# Patient Record
Sex: Female | Born: 1935 | Race: White | Hispanic: No | Marital: Married | Smoking: Former smoker
Health system: Southern US, Community
[De-identification: ages and names within clinical notes are randomized; demographics above are authoritative.]

## PROBLEM LIST (undated history)

## (undated) DIAGNOSIS — F0280 Dementia in other diseases classified elsewhere without behavioral disturbance: Secondary | ICD-10-CM

## (undated) DIAGNOSIS — F0390 Unspecified dementia without behavioral disturbance: Secondary | ICD-10-CM

## (undated) DIAGNOSIS — G309 Alzheimer's disease, unspecified: Secondary | ICD-10-CM

---

## 2004-08-23 ENCOUNTER — Ambulatory Visit: Payer: Self-pay | Admitting: Unknown Physician Specialty

## 2004-09-15 ENCOUNTER — Ambulatory Visit: Payer: Self-pay | Admitting: Cardiology

## 2004-11-01 ENCOUNTER — Encounter: Payer: Self-pay | Admitting: Cardiology

## 2004-11-21 ENCOUNTER — Encounter: Payer: Self-pay | Admitting: Cardiology

## 2004-12-22 ENCOUNTER — Encounter: Payer: Self-pay | Admitting: Cardiology

## 2005-01-21 ENCOUNTER — Encounter: Payer: Self-pay | Admitting: Cardiology

## 2005-02-21 ENCOUNTER — Encounter: Payer: Self-pay | Admitting: Cardiology

## 2006-03-06 ENCOUNTER — Ambulatory Visit: Payer: Self-pay | Admitting: Unknown Physician Specialty

## 2007-03-25 ENCOUNTER — Other Ambulatory Visit: Payer: Self-pay

## 2007-03-25 ENCOUNTER — Inpatient Hospital Stay: Payer: Self-pay | Admitting: Internal Medicine

## 2007-03-26 ENCOUNTER — Other Ambulatory Visit: Payer: Self-pay

## 2007-10-28 ENCOUNTER — Ambulatory Visit: Payer: Self-pay | Admitting: Unknown Physician Specialty

## 2009-08-16 ENCOUNTER — Ambulatory Visit: Payer: Self-pay | Admitting: Unknown Physician Specialty

## 2009-09-03 ENCOUNTER — Ambulatory Visit: Payer: Self-pay | Admitting: Unknown Physician Specialty

## 2009-10-03 ENCOUNTER — Inpatient Hospital Stay: Payer: Self-pay | Admitting: Student

## 2010-01-20 ENCOUNTER — Inpatient Hospital Stay: Payer: Self-pay | Admitting: Internal Medicine

## 2010-07-19 ENCOUNTER — Inpatient Hospital Stay: Payer: Self-pay | Admitting: Specialist

## 2010-11-25 ENCOUNTER — Observation Stay: Payer: Self-pay | Admitting: Internal Medicine

## 2011-04-03 ENCOUNTER — Ambulatory Visit: Payer: Self-pay | Admitting: Family Medicine

## 2011-06-28 IMAGING — CR LEFT WRIST - COMPLETE 3+ VIEW
1 series · 4 of 4 positions shown · non-contrast
Comparison: none

REASON FOR EXAM: pain and swelling
COMMENTS:   May transport without cardiac monitor

[Series 1: view not recorded · 0.17mm/px · 4 of 4 slices shown]
[im 1/4]
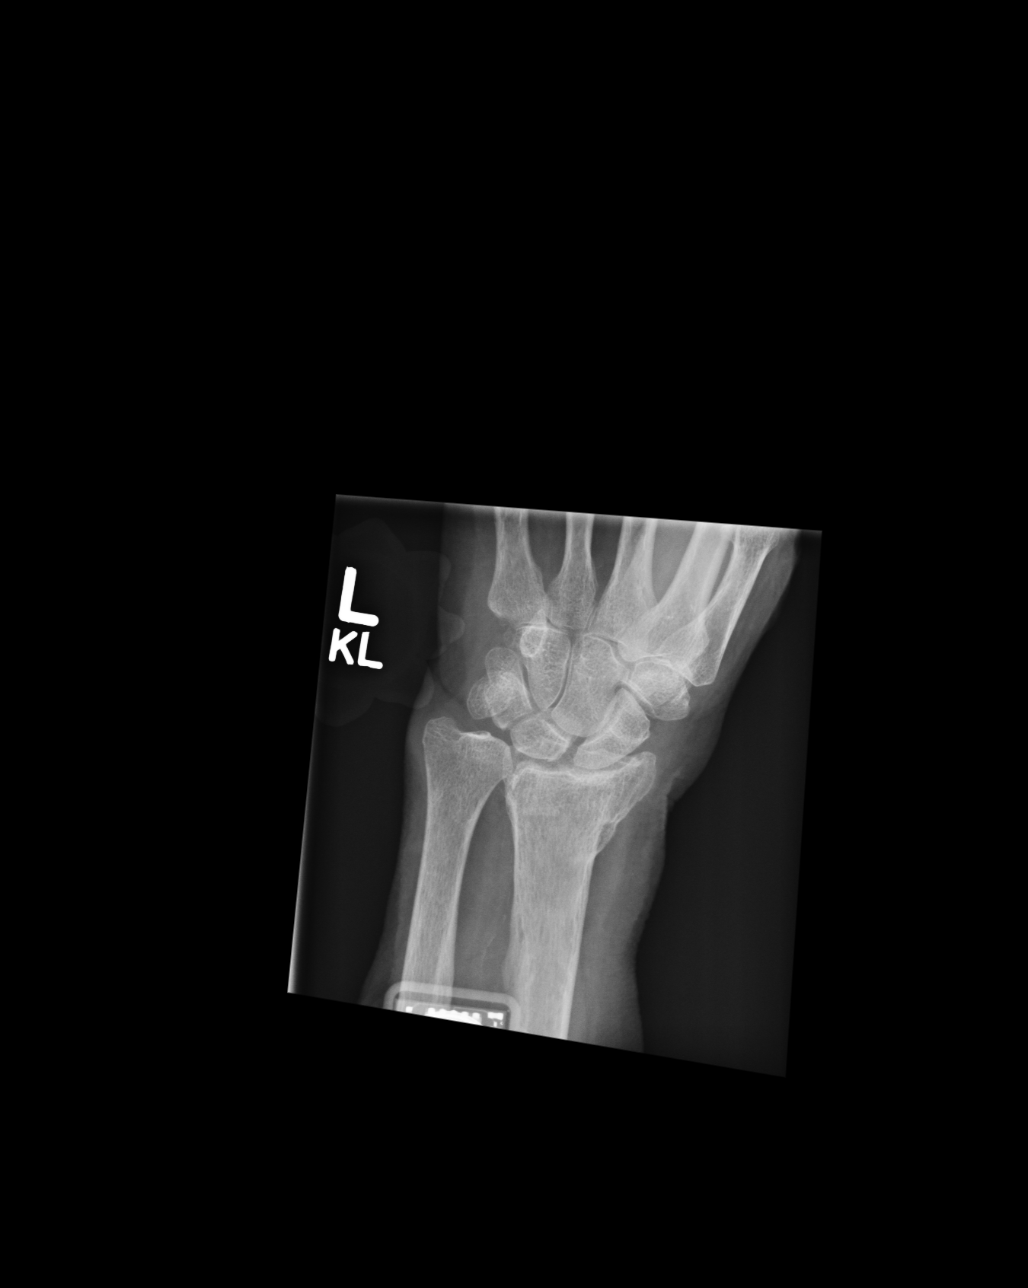
[im 2/4]
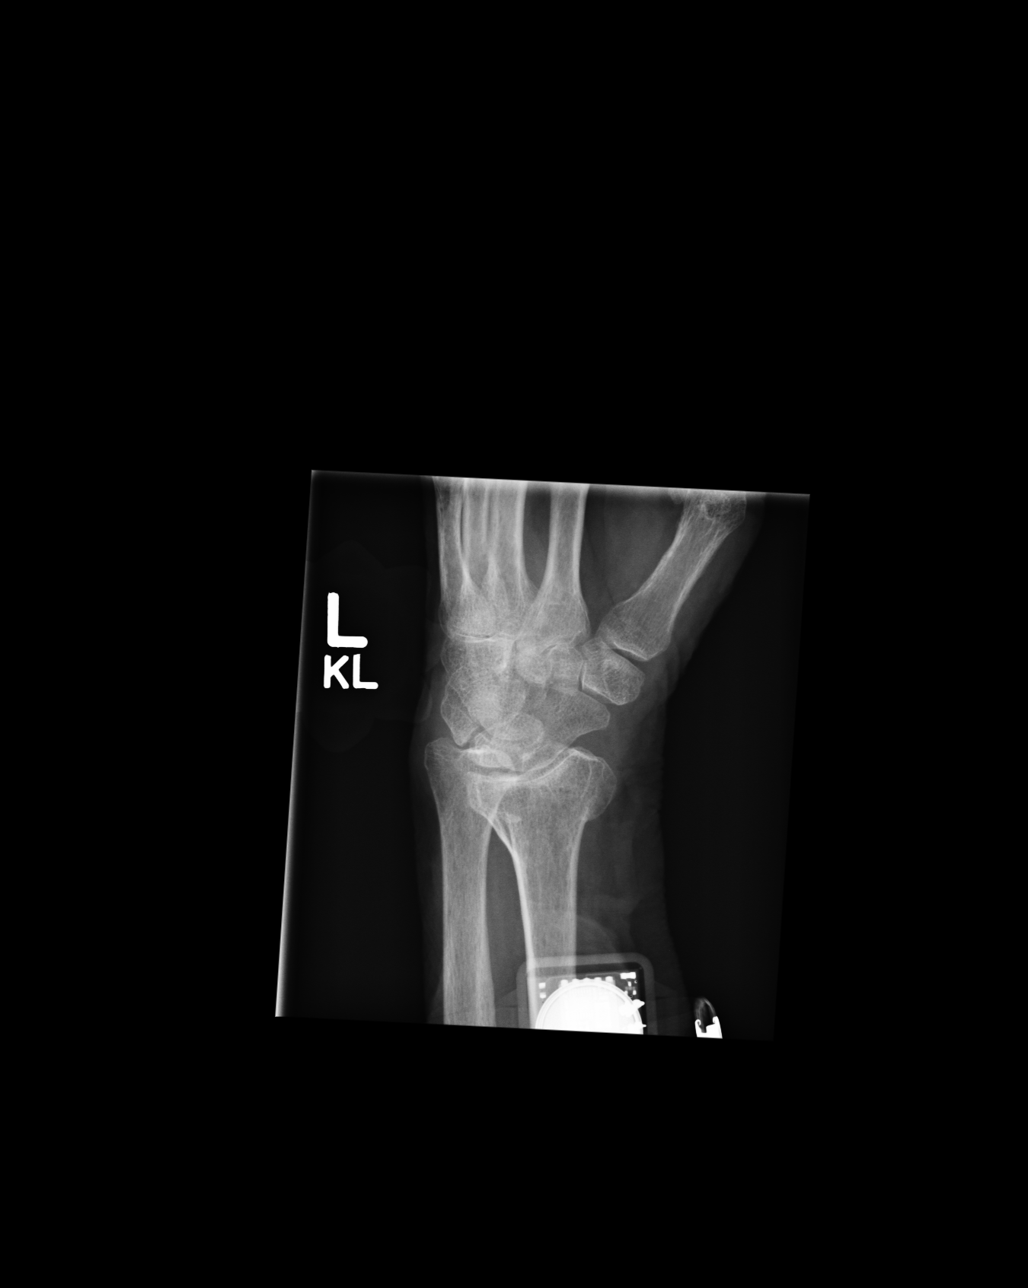
[im 3/4]
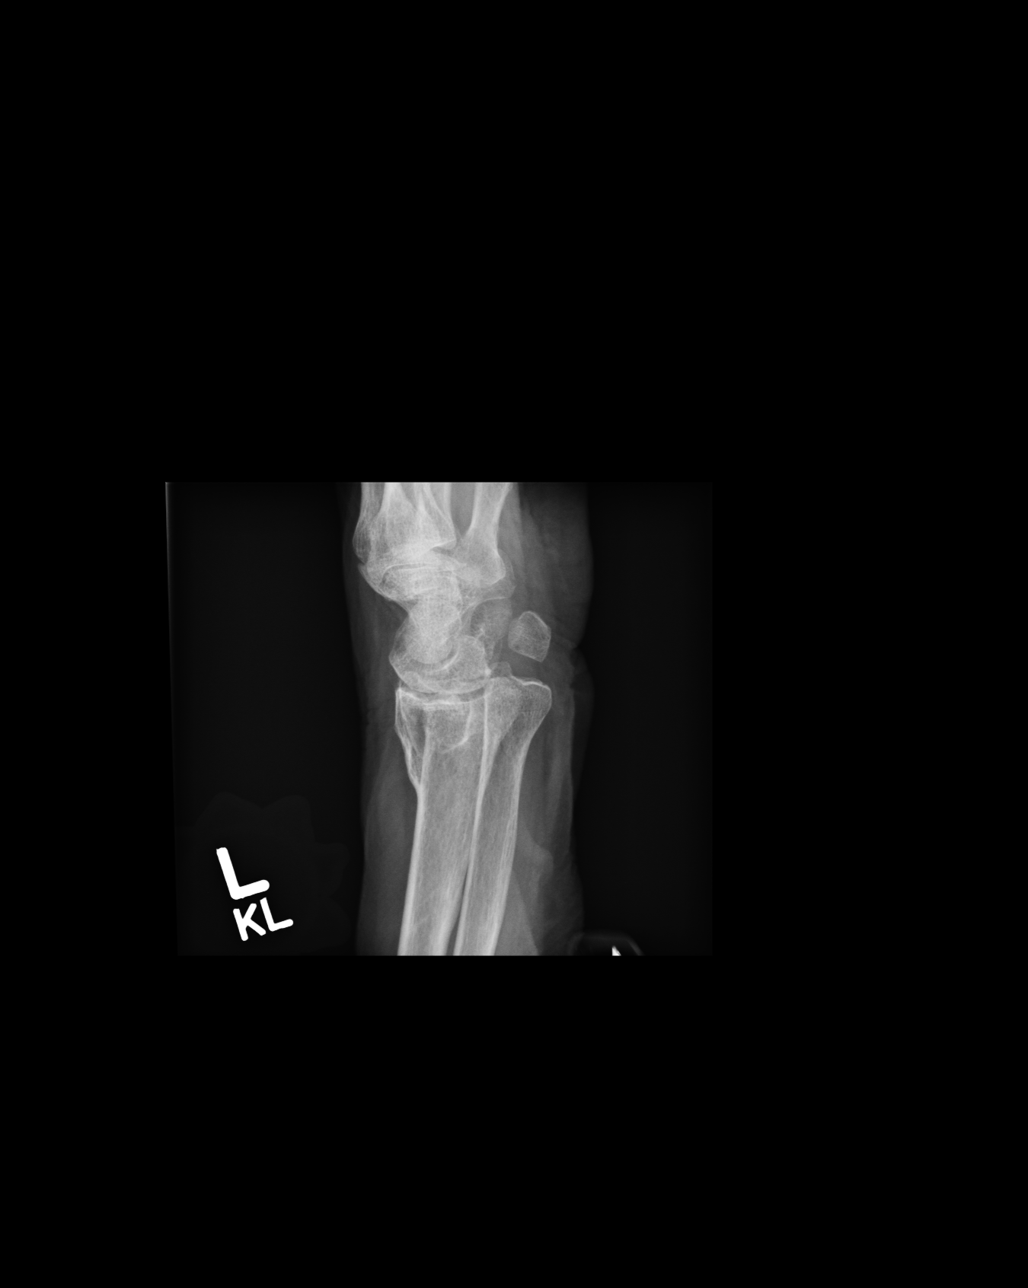
[im 4/4]
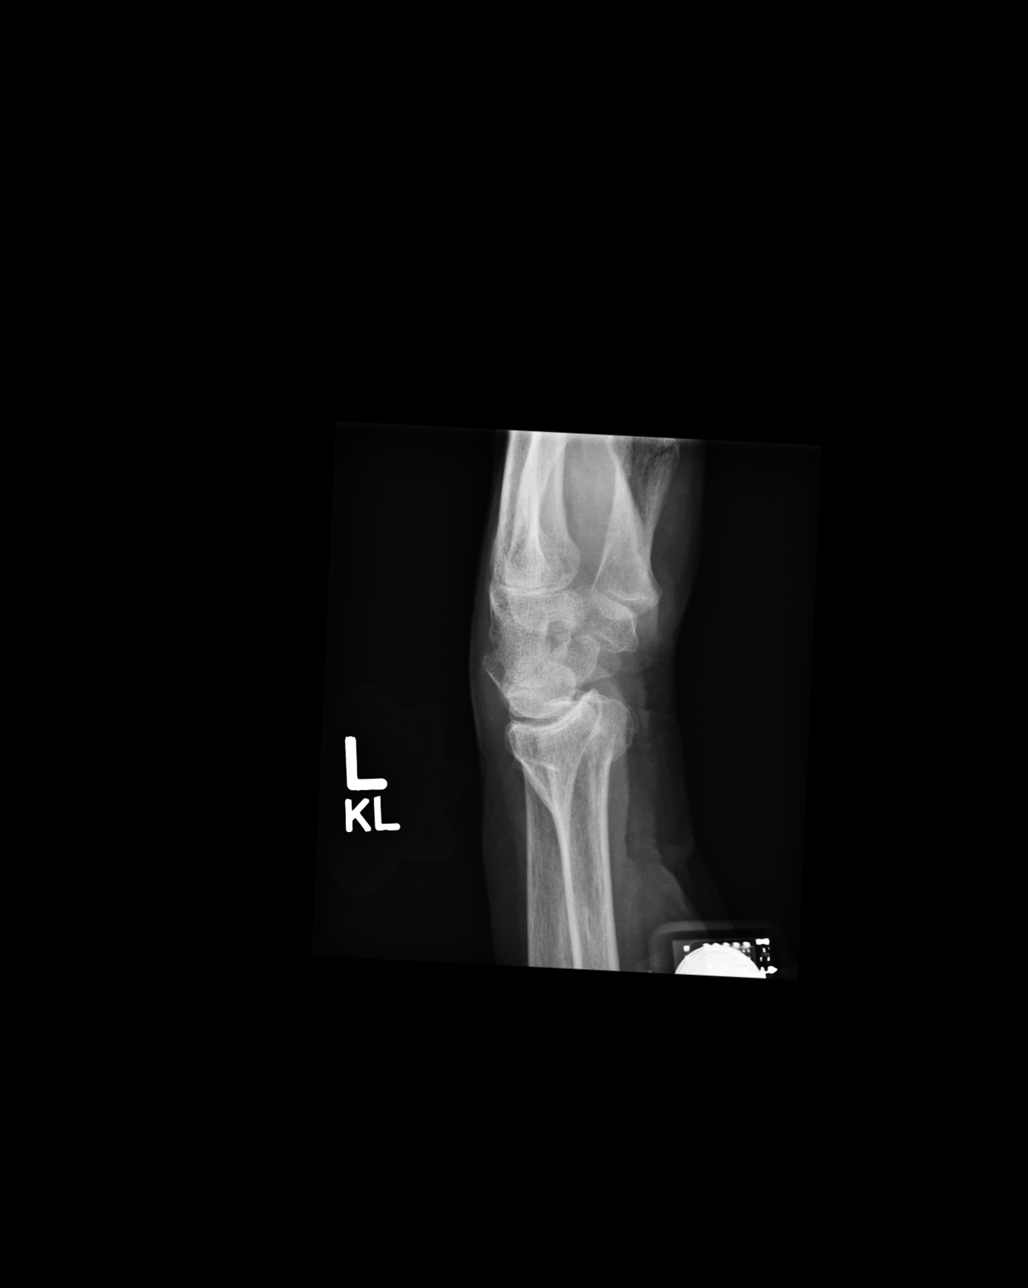

[4 of 4 positions shown; findings below may reference images not displayed]

PROCEDURE:     DXR - DXR WRIST LT COMP WITH OBLIQUES  - July 19, 2010 [DATE]

RESULT:     There is a healed or partially healed fracture of the distal
left radius. There is mild dorsal angulation of the major distal radial
fracture component. No acute fracture about the wrist is seen. The carpal
bones of the wrist are intact.
IMPRESSION: There is deformity of the distal left radius compatible with
prior fracture that has now healed or partially healed. No acute fracture is
seen.

## 2011-06-30 IMAGING — US US EXTREM LOW VENOUS BILAT
1 series · 12 of 24 positions shown · non-contrast
Comparison: none

REASON FOR EXAM: hx of DVT.  Leg swelling.  Taking Off Coumadin.
COMMENTS:

PROCEDURE:     US  - US DOPPLER LOW EXTR BILATERAL  - July 21, 2010  [DATE]
RESULT:     Bilateral lower extremity color flow Duplex Doppler reveals no
evidence of deep venous thrombosis.

[Series 1: us extrem low venous bilat · 53 acquisitions, 12 frames shown]
[im 3/53]
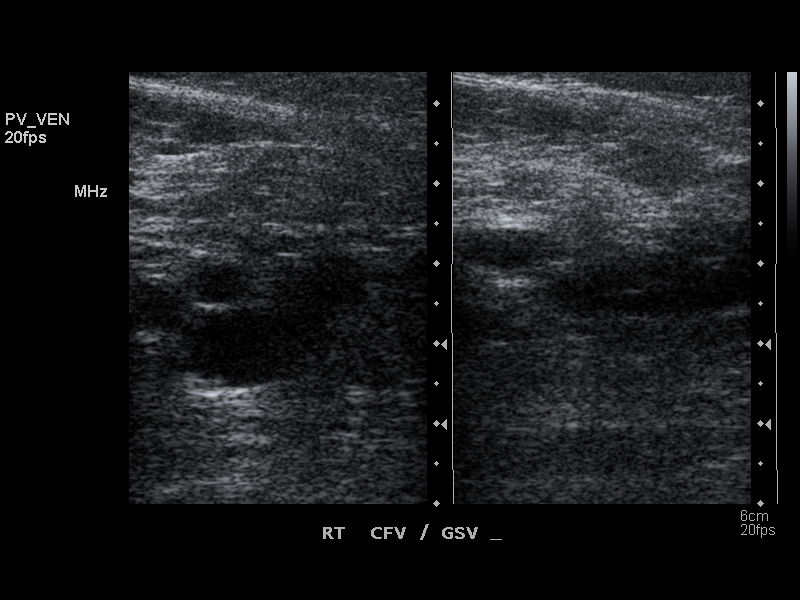
[im 7/53]
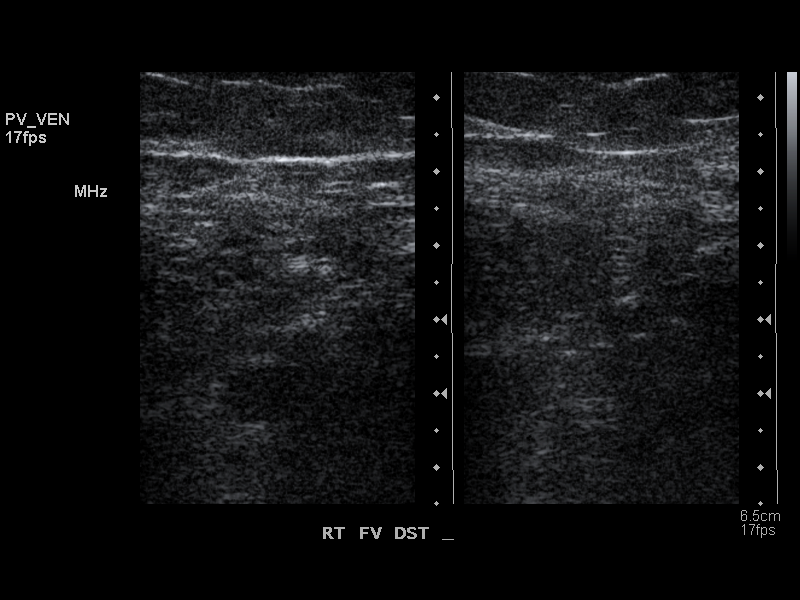
[im 12/53]
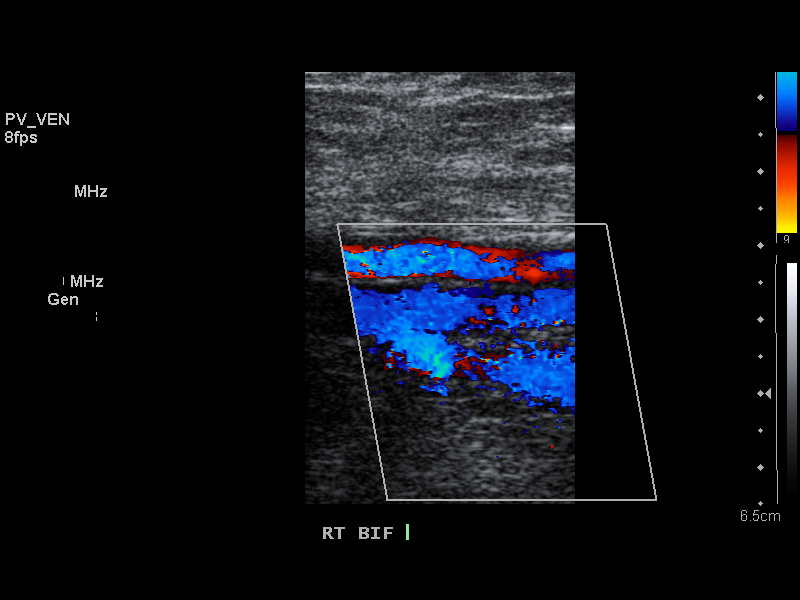
[im 16/53]
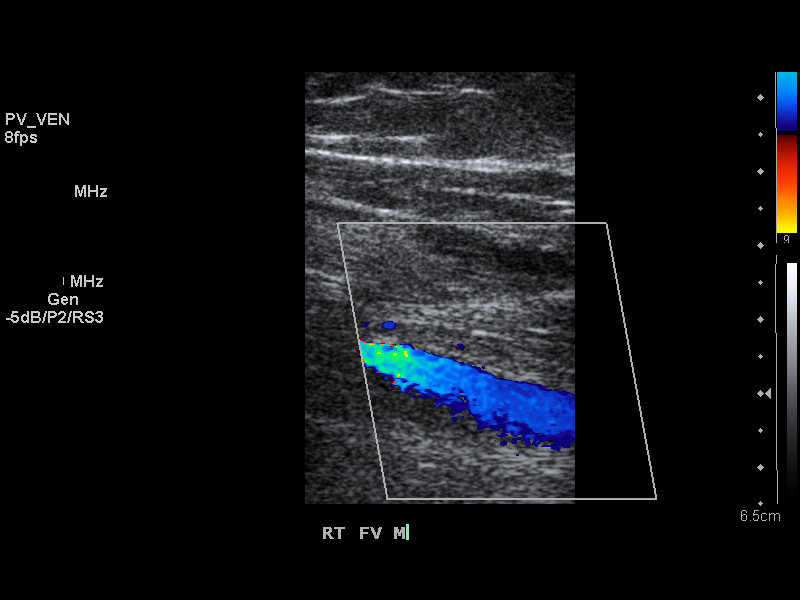
[im 21/53]
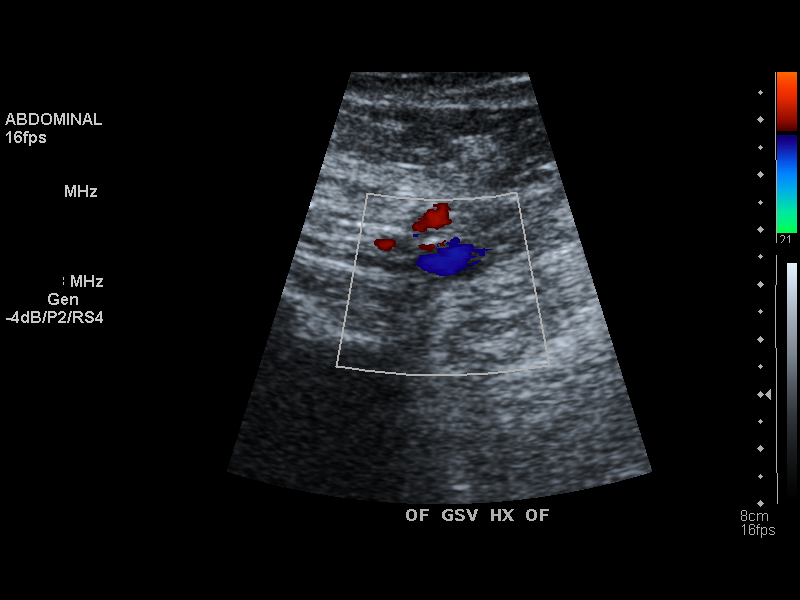
[im 25/53]
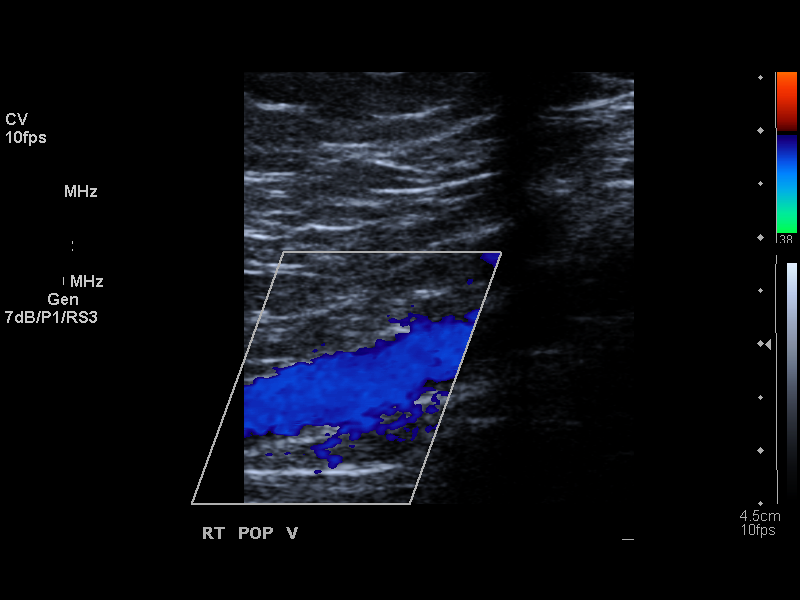
[im 32/53]
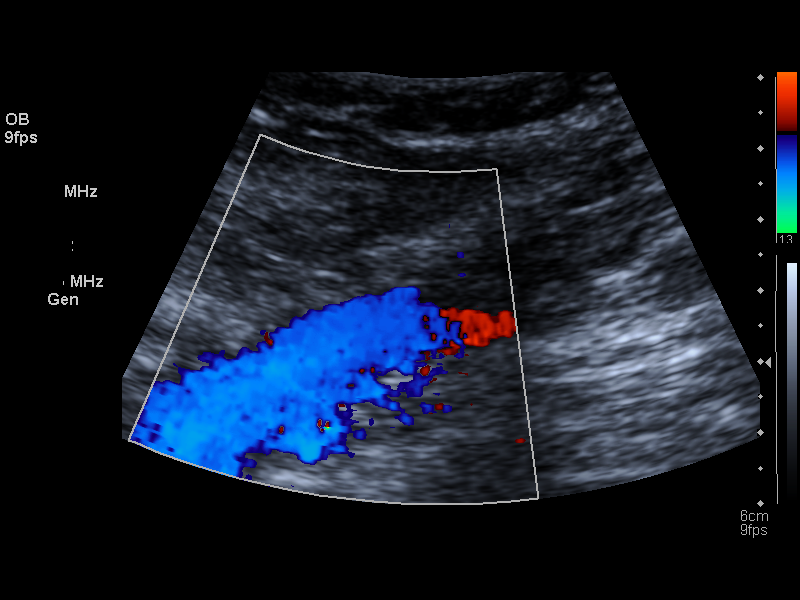
[im 37/53]
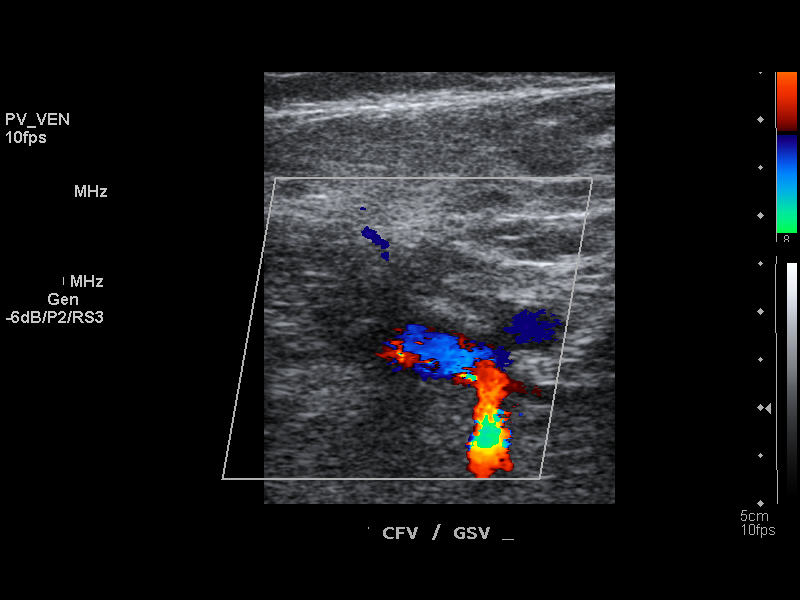
[im 41/53]
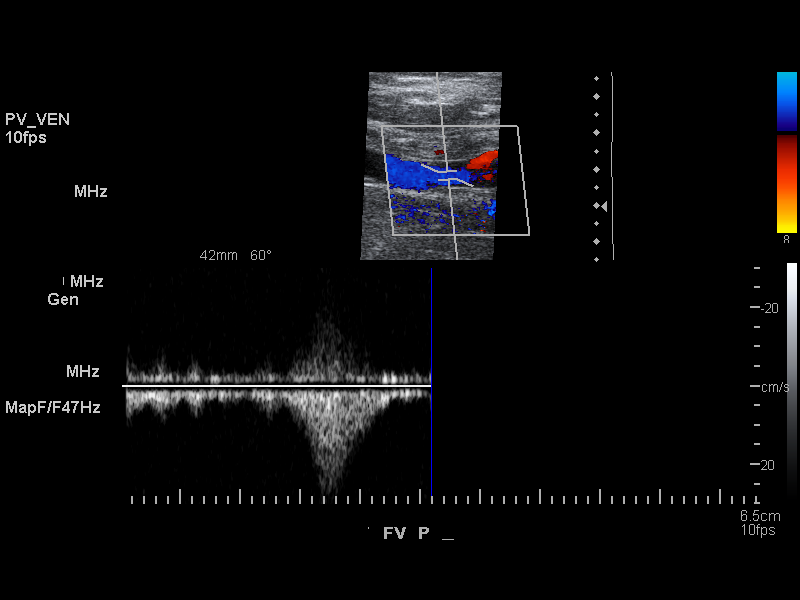
[im 46/53]
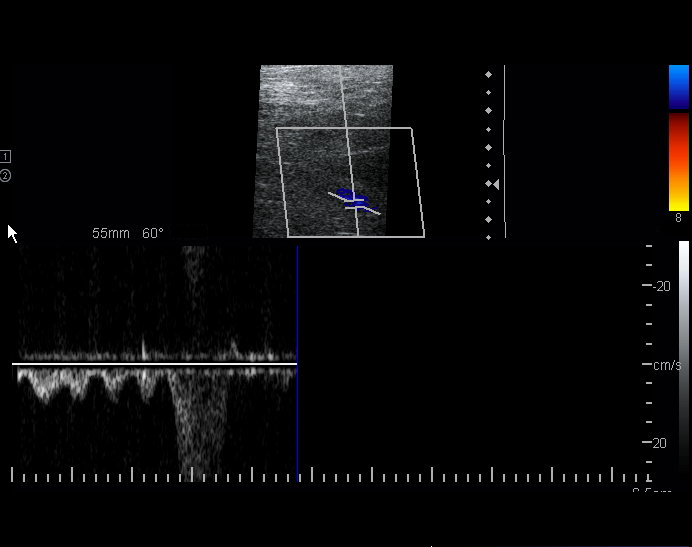
[im 48/53]
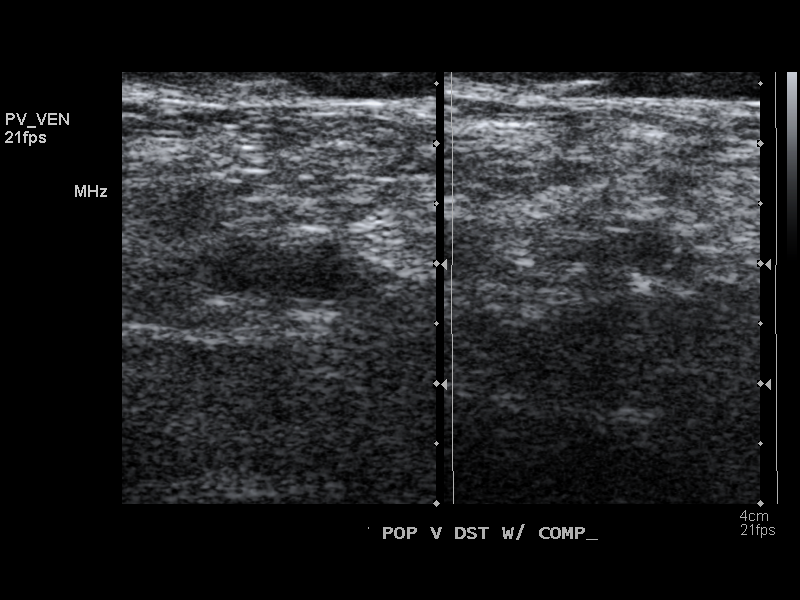
[im 53/53]
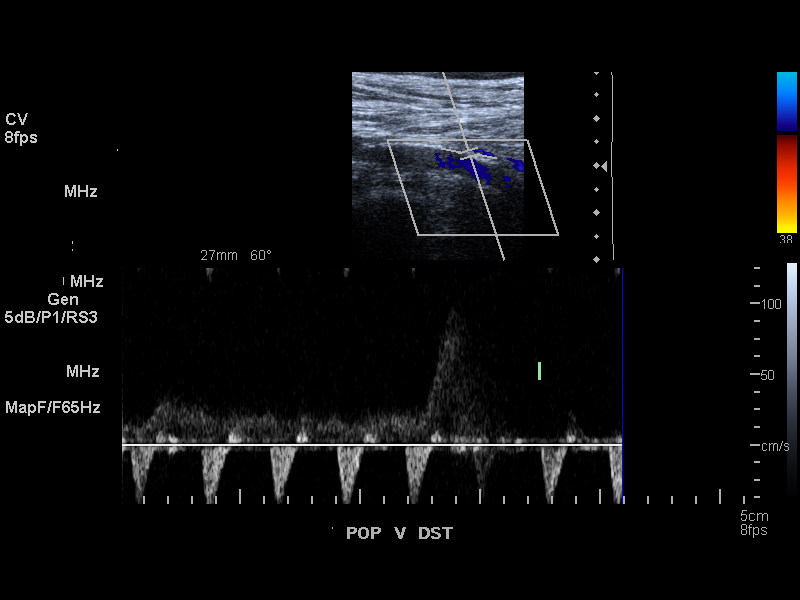

[12 of 24 positions shown; findings below may reference images not displayed]

IMPRESSION: Negative exam.

## 2012-02-28 ENCOUNTER — Inpatient Hospital Stay: Payer: Self-pay | Admitting: Internal Medicine

## 2012-02-28 LAB — CBC WITH DIFFERENTIAL/PLATELET
Basophil #: 0 10*3/uL (ref 0.0–0.1)
Basophil %: 0.1 %
Eosinophil #: 0 10*3/uL (ref 0.0–0.7)
Eosinophil %: 0 %
HCT: 40.2 % (ref 35.0–47.0)
Lymphocyte %: 2.4 %
MCH: 31.4 pg (ref 26.0–34.0)
MCV: 94 fL (ref 80–100)
Monocyte #: 1.9 x10 3/mm — ABNORMAL HIGH (ref 0.2–0.9)
Monocyte %: 9.4 %
Neutrophil #: 17.9 10*3/uL — ABNORMAL HIGH (ref 1.4–6.5)
Platelet: 172 10*3/uL (ref 150–440)
RBC: 4.27 10*6/uL (ref 3.80–5.20)
RDW: 13.3 % (ref 11.5–14.5)
WBC: 20.3 10*3/uL — ABNORMAL HIGH (ref 3.6–11.0)

## 2012-02-28 LAB — COMPREHENSIVE METABOLIC PANEL
Albumin: 2.7 g/dL — ABNORMAL LOW (ref 3.4–5.0)
Alkaline Phosphatase: 90 U/L (ref 50–136)
Anion Gap: 10 (ref 7–16)
BUN: 39 mg/dL — ABNORMAL HIGH (ref 7–18)
Calcium, Total: 8.7 mg/dL (ref 8.5–10.1)
Co2: 25 mmol/L (ref 21–32)
Glucose: 144 mg/dL — ABNORMAL HIGH (ref 65–99)
Osmolality: 291 (ref 275–301)
Potassium: 3.7 mmol/L (ref 3.5–5.1)
SGOT(AST): 16 U/L (ref 15–37)
SGPT (ALT): <6 U/L — ABNORMAL LOW (ref 12–78)
Sodium: 140 mmol/L (ref 136–145)
Total Protein: 6.7 g/dL (ref 6.4–8.2)

## 2012-02-28 LAB — URINALYSIS, COMPLETE
Bilirubin,UR: NEGATIVE
Ketone: NEGATIVE
Ph: 5 (ref 4.5–8.0)
Protein: 100
Specific Gravity: 1.019 (ref 1.003–1.030)
Squamous Epithelial: NONE SEEN

## 2012-02-28 LAB — TROPONIN I: Troponin-I: 0.04 ng/mL

## 2012-02-29 LAB — CBC WITH DIFFERENTIAL/PLATELET
Basophil #: 0 10*3/uL (ref 0.0–0.1)
Eosinophil #: 0.1 10*3/uL (ref 0.0–0.7)
HGB: 11.5 g/dL — ABNORMAL LOW (ref 12.0–16.0)
Lymphocyte #: 0.4 10*3/uL — ABNORMAL LOW (ref 1.0–3.6)
MCH: 32.3 pg (ref 26.0–34.0)
MCHC: 34.7 g/dL (ref 32.0–36.0)
MCV: 93 fL (ref 80–100)
Monocyte #: 1 x10 3/mm — ABNORMAL HIGH (ref 0.2–0.9)
Neutrophil %: 84.4 %
Platelet: 152 10*3/uL (ref 150–440)
RDW: 13.7 % (ref 11.5–14.5)

## 2012-02-29 LAB — COMPREHENSIVE METABOLIC PANEL
Alkaline Phosphatase: 78 U/L (ref 50–136)
Anion Gap: 11 (ref 7–16)
BUN: 30 mg/dL — ABNORMAL HIGH (ref 7–18)
Bilirubin,Total: 0.4 mg/dL (ref 0.2–1.0)
Chloride: 110 mmol/L — ABNORMAL HIGH (ref 98–107)
Co2: 21 mmol/L (ref 21–32)
EGFR (African American): 35 — ABNORMAL LOW
EGFR (Non-African Amer.): 30 — ABNORMAL LOW
Osmolality: 288 (ref 275–301)
SGOT(AST): 11 U/L — ABNORMAL LOW (ref 15–37)
SGPT (ALT): 6 U/L — ABNORMAL LOW (ref 12–78)
Sodium: 142 mmol/L (ref 136–145)
Total Protein: 5.6 g/dL — ABNORMAL LOW (ref 6.4–8.2)

## 2012-03-01 LAB — BASIC METABOLIC PANEL
Anion Gap: 8 (ref 7–16)
BUN: 20 mg/dL — ABNORMAL HIGH (ref 7–18)
Calcium, Total: 8.1 mg/dL — ABNORMAL LOW (ref 8.5–10.1)
Chloride: 111 mmol/L — ABNORMAL HIGH (ref 98–107)
Co2: 22 mmol/L (ref 21–32)
Creatinine: 1.38 mg/dL — ABNORMAL HIGH (ref 0.60–1.30)
EGFR (African American): 40 — ABNORMAL LOW
EGFR (Non-African Amer.): 35 — ABNORMAL LOW
Glucose: 132 mg/dL — ABNORMAL HIGH (ref 65–99)
Osmolality: 286 (ref 275–301)
Potassium: 3.4 mmol/L — ABNORMAL LOW (ref 3.5–5.1)
Sodium: 141 mmol/L (ref 136–145)

## 2012-03-01 LAB — URINE CULTURE

## 2012-03-04 LAB — CULTURE, BLOOD (SINGLE)

## 2012-05-29 ENCOUNTER — Ambulatory Visit: Payer: Self-pay | Admitting: Family Medicine

## 2012-06-30 ENCOUNTER — Observation Stay: Payer: Self-pay | Admitting: Internal Medicine

## 2012-06-30 LAB — COMPREHENSIVE METABOLIC PANEL
Albumin: 3.1 g/dL — ABNORMAL LOW (ref 3.4–5.0)
Anion Gap: 6 — ABNORMAL LOW (ref 7–16)
BUN: 11 mg/dL (ref 7–18)
Bilirubin,Total: 0.5 mg/dL (ref 0.2–1.0)
Chloride: 109 mmol/L — ABNORMAL HIGH (ref 98–107)
Co2: 26 mmol/L (ref 21–32)
Creatinine: 0.91 mg/dL (ref 0.60–1.30)
EGFR (African American): >60
EGFR (Non-African Amer.): 57 — ABNORMAL LOW
Potassium: 4.6 mmol/L (ref 3.5–5.1)
SGOT(AST): 27 U/L (ref 15–37)
Total Protein: 6.7 g/dL (ref 6.4–8.2)

## 2012-06-30 LAB — URINALYSIS, COMPLETE
Bacteria: NONE SEEN
Bilirubin,UR: NEGATIVE
Glucose,UR: NEGATIVE mg/dL (ref 0–75)
Ketone: NEGATIVE
Nitrite: NEGATIVE
Ph: 7 (ref 4.5–8.0)
Specific Gravity: 1.009 (ref 1.003–1.030)
Squamous Epithelial: <1
WBC UR: 11 /HPF (ref 0–5)

## 2012-06-30 LAB — CBC
HCT: 40.3 % (ref 35.0–47.0)
HGB: 13.4 g/dL (ref 12.0–16.0)
MCH: 29.9 pg (ref 26.0–34.0)
MCV: 90 fL (ref 80–100)
Platelet: 181 10*3/uL (ref 150–440)
RBC: 4.46 10*6/uL (ref 3.80–5.20)
WBC: 6.6 10*3/uL (ref 3.6–11.0)

## 2012-07-01 LAB — BASIC METABOLIC PANEL
Anion Gap: 9 (ref 7–16)
BUN: 11 mg/dL (ref 7–18)
Creatinine: 1.15 mg/dL (ref 0.60–1.30)
Glucose: 77 mg/dL (ref 65–99)
Osmolality: 283 (ref 275–301)
Potassium: 3.7 mmol/L (ref 3.5–5.1)

## 2012-07-01 LAB — CBC WITH DIFFERENTIAL/PLATELET
Eosinophil %: 2.6 %
HCT: 40.4 % (ref 35.0–47.0)
Lymphocyte #: 1.1 10*3/uL (ref 1.0–3.6)
Lymphocyte %: 19.6 %
MCH: 30.5 pg (ref 26.0–34.0)
MCHC: 33.5 g/dL (ref 32.0–36.0)
MCV: 91 fL (ref 80–100)
Monocyte #: 0.5 x10 3/mm (ref 0.2–0.9)
Monocyte %: 9 %
Neutrophil #: 3.9 10*3/uL (ref 1.4–6.5)
Platelet: 175 10*3/uL (ref 150–440)
RDW: 13.7 % (ref 11.5–14.5)

## 2012-07-19 LAB — URINE CULTURE

## 2013-05-21 ENCOUNTER — Emergency Department: Payer: Self-pay | Admitting: Emergency Medicine

## 2013-05-21 LAB — CBC
HCT: 43.5 % (ref 35.0–47.0)
MCH: 29.9 pg (ref 26.0–34.0)
MCHC: 33.9 g/dL (ref 32.0–36.0)
RBC: 4.93 10*6/uL (ref 3.80–5.20)
RDW: 14 % (ref 11.5–14.5)

## 2013-05-21 LAB — URINALYSIS, COMPLETE
Bilirubin,UR: NEGATIVE
Glucose,UR: NEGATIVE mg/dL (ref 0–75)
Nitrite: POSITIVE
RBC,UR: 2 /HPF (ref 0–5)

## 2013-05-21 LAB — COMPREHENSIVE METABOLIC PANEL
Albumin: 3.3 g/dL — ABNORMAL LOW (ref 3.4–5.0)
Anion Gap: 5 — ABNORMAL LOW (ref 7–16)
BUN: 15 mg/dL (ref 7–18)
Bilirubin,Total: 0.4 mg/dL (ref 0.2–1.0)
Calcium, Total: 9.2 mg/dL (ref 8.5–10.1)
Co2: 29 mmol/L (ref 21–32)
Creatinine: 1.23 mg/dL (ref 0.60–1.30)
Glucose: 92 mg/dL (ref 65–99)
Potassium: 3.6 mmol/L (ref 3.5–5.1)
SGOT(AST): 16 U/L (ref 15–37)
SGPT (ALT): 15 U/L (ref 12–78)
Sodium: 137 mmol/L (ref 136–145)

## 2013-05-21 LAB — TROPONIN I: Troponin-I: <0.02 ng/mL

## 2014-02-14 ENCOUNTER — Inpatient Hospital Stay: Payer: Self-pay | Admitting: Internal Medicine

## 2014-02-14 LAB — CBC WITH DIFFERENTIAL/PLATELET
Basophil #: 0.1 10*3/uL (ref 0.0–0.1)
Basophil %: 1.2 %
EOS PCT: 1.6 %
Eosinophil #: 0.1 10*3/uL (ref 0.0–0.7)
HCT: 48.4 % — ABNORMAL HIGH (ref 35.0–47.0)
HGB: 15.7 g/dL (ref 12.0–16.0)
LYMPHS ABS: 1.4 10*3/uL (ref 1.0–3.6)
Lymphocyte %: 18.5 %
MCH: 29.9 pg (ref 26.0–34.0)
MCHC: 32.5 g/dL (ref 32.0–36.0)
MCV: 92 fL (ref 80–100)
MONO ABS: 0.6 x10 3/mm (ref 0.2–0.9)
Monocyte %: 8.1 %
Neutrophil #: 5.5 10*3/uL (ref 1.4–6.5)
Neutrophil %: 70.6 %
Platelet: 184 10*3/uL (ref 150–440)
RBC: 5.26 10*6/uL — ABNORMAL HIGH (ref 3.80–5.20)
RDW: 15.6 % — ABNORMAL HIGH (ref 11.5–14.5)
WBC: 7.8 10*3/uL (ref 3.6–11.0)

## 2014-02-14 LAB — COMPREHENSIVE METABOLIC PANEL
ALK PHOS: 71 U/L
ALT: 19 U/L
Albumin: 3.1 g/dL — ABNORMAL LOW (ref 3.4–5.0)
Anion Gap: 7 (ref 7–16)
BUN: 15 mg/dL (ref 7–18)
Bilirubin,Total: 0.4 mg/dL (ref 0.2–1.0)
CREATININE: 1.03 mg/dL (ref 0.60–1.30)
Calcium, Total: 8.9 mg/dL (ref 8.5–10.1)
Chloride: 109 mmol/L — ABNORMAL HIGH (ref 98–107)
Co2: 27 mmol/L (ref 21–32)
EGFR (Non-African Amer.): 48 — ABNORMAL LOW
GFR CALC AF AMER: 56 — AB
Glucose: 105 mg/dL — ABNORMAL HIGH (ref 65–99)
Osmolality: 286 (ref 275–301)
Potassium: 4.1 mmol/L (ref 3.5–5.1)
SGOT(AST): 17 U/L (ref 15–37)
Sodium: 143 mmol/L (ref 136–145)
TOTAL PROTEIN: 6.9 g/dL (ref 6.4–8.2)

## 2014-02-14 LAB — APTT: ACTIVATED PTT: 41 s — AB (ref 23.6–35.9)

## 2014-02-14 LAB — URINALYSIS, COMPLETE
BLOOD: NEGATIVE
Bacteria: NONE SEEN
Bilirubin,UR: NEGATIVE
GLUCOSE, UR: NEGATIVE mg/dL (ref 0–75)
KETONE: NEGATIVE
Nitrite: NEGATIVE
Ph: 7 (ref 4.5–8.0)
Protein: 30
RBC,UR: 3 /HPF (ref 0–5)
SPECIFIC GRAVITY: 1.012 (ref 1.003–1.030)
Squamous Epithelial: 1

## 2014-02-14 LAB — PROTIME-INR
INR: 1
PROTHROMBIN TIME: 12.8 s (ref 11.5–14.7)

## 2014-02-14 LAB — TROPONIN I

## 2014-02-15 LAB — CBC WITH DIFFERENTIAL/PLATELET
Basophil #: 0.1 10*3/uL (ref 0.0–0.1)
Basophil %: 0.9 %
EOS ABS: 0.1 10*3/uL (ref 0.0–0.7)
EOS PCT: 1.7 %
HCT: 45.9 % (ref 35.0–47.0)
HGB: 15.2 g/dL (ref 12.0–16.0)
Lymphocyte #: 1 10*3/uL (ref 1.0–3.6)
Lymphocyte %: 12.5 %
MCH: 30 pg (ref 26.0–34.0)
MCHC: 33.1 g/dL (ref 32.0–36.0)
MCV: 91 fL (ref 80–100)
Monocyte #: 0.7 x10 3/mm (ref 0.2–0.9)
Monocyte %: 8.7 %
NEUTROS PCT: 76.2 %
Neutrophil #: 6.4 10*3/uL (ref 1.4–6.5)
Platelet: 173 10*3/uL (ref 150–440)
RBC: 5.06 10*6/uL (ref 3.80–5.20)
RDW: 15.2 % — ABNORMAL HIGH (ref 11.5–14.5)
WBC: 8.3 10*3/uL (ref 3.6–11.0)

## 2014-02-15 LAB — BASIC METABOLIC PANEL
ANION GAP: 8 (ref 7–16)
BUN: 13 mg/dL (ref 7–18)
CALCIUM: 8.9 mg/dL (ref 8.5–10.1)
Chloride: 108 mmol/L — ABNORMAL HIGH (ref 98–107)
Co2: 25 mmol/L (ref 21–32)
Creatinine: 0.89 mg/dL (ref 0.60–1.30)
EGFR (Non-African Amer.): 58 — ABNORMAL LOW
GLUCOSE: 98 mg/dL (ref 65–99)
Osmolality: 281 (ref 275–301)
Potassium: 3.8 mmol/L (ref 3.5–5.1)
SODIUM: 141 mmol/L (ref 136–145)

## 2014-02-16 LAB — URINE CULTURE

## 2014-02-17 LAB — BASIC METABOLIC PANEL
Anion Gap: 11 (ref 7–16)
BUN: 15 mg/dL (ref 7–18)
CALCIUM: 8.8 mg/dL (ref 8.5–10.1)
CREATININE: 1.02 mg/dL (ref 0.60–1.30)
Chloride: 106 mmol/L (ref 98–107)
Co2: 24 mmol/L (ref 21–32)
EGFR (African American): 57 — ABNORMAL LOW
EGFR (Non-African Amer.): 49 — ABNORMAL LOW
Glucose: 83 mg/dL (ref 65–99)
Osmolality: 281 (ref 275–301)
POTASSIUM: 3.1 mmol/L — AB (ref 3.5–5.1)
Sodium: 141 mmol/L (ref 136–145)

## 2014-03-17 ENCOUNTER — Emergency Department: Payer: Self-pay | Admitting: Emergency Medicine

## 2014-03-17 LAB — COMPREHENSIVE METABOLIC PANEL
ANION GAP: 11 (ref 7–16)
Albumin: 3.1 g/dL — ABNORMAL LOW (ref 3.4–5.0)
Alkaline Phosphatase: 58 U/L
BUN: 19 mg/dL — AB (ref 7–18)
Bilirubin,Total: 0.5 mg/dL (ref 0.2–1.0)
CHLORIDE: 105 mmol/L (ref 98–107)
CREATININE: 1.22 mg/dL (ref 0.60–1.30)
Calcium, Total: 9 mg/dL (ref 8.5–10.1)
Co2: 27 mmol/L (ref 21–32)
EGFR (Non-African Amer.): 40 — ABNORMAL LOW
GFR CALC AF AMER: 46 — AB
Glucose: 88 mg/dL (ref 65–99)
OSMOLALITY: 287 (ref 275–301)
Potassium: 4.1 mmol/L (ref 3.5–5.1)
SGOT(AST): 22 U/L (ref 15–37)
SGPT (ALT): 19 U/L
Sodium: 143 mmol/L (ref 136–145)
Total Protein: 6.5 g/dL (ref 6.4–8.2)

## 2014-03-17 LAB — URINALYSIS, COMPLETE
Bacteria: NONE SEEN
Bilirubin,UR: NEGATIVE
Blood: NEGATIVE
GLUCOSE, UR: NEGATIVE mg/dL (ref 0–75)
Ketone: NEGATIVE
LEUKOCYTE ESTERASE: NEGATIVE
NITRITE: NEGATIVE
PH: 6 (ref 4.5–8.0)
PROTEIN: NEGATIVE
Specific Gravity: 1.008 (ref 1.003–1.030)

## 2014-03-17 LAB — CBC
HCT: 43.1 % (ref 35.0–47.0)
HGB: 13.7 g/dL (ref 12.0–16.0)
MCH: 29.6 pg (ref 26.0–34.0)
MCHC: 31.7 g/dL — ABNORMAL LOW (ref 32.0–36.0)
MCV: 93 fL (ref 80–100)
Platelet: 147 10*3/uL — ABNORMAL LOW (ref 150–440)
RBC: 4.63 10*6/uL (ref 3.80–5.20)
RDW: 14.9 % — ABNORMAL HIGH (ref 11.5–14.5)
WBC: 5.6 10*3/uL (ref 3.6–11.0)

## 2014-03-17 LAB — TROPONIN I: TROPONIN-I: 0.02 ng/mL

## 2014-11-10 NOTE — H&P (Signed)
PATIENT NAME:  Abigail [name], STARKEY MR#:  [id number] DATE OF BIRTH:  08/31/1923  DATE OF ADMISSION:  02/28/2012  REFERRING PHYSICIAN: Dr. Chiquita Loth  PRIMARY CARE PHYSICIAN: Dr. Silver Huguenin    CHIEF COMPLAINT: Fever and altered mental status.   HISTORY OF PRESENT ILLNESS: This is an 81 year old female who is a nursing home resident was sent because of altered mental status and fever. There is no documentation of how much was the fever at the nursing home, upon presentation patient had temperature of 98, but was hypotensive with systolic blood pressure of 77, as well had labs showing acute renal failure with normal baseline creatinine currently presents with creatinine of 2.7. As well had leukocytosis of 20,000. Patient's urinalysis came back positive for urinary tract infection, patient was cultured where blood cultures and urine cultures were sent and was started on IV Levaquin in ED and is being bolused with IV normal saline. Upon my examination patient's mental status much improved where she is awake, alert, oriented x3. Patient has been complaining of generalized weakness, poor appetite, feeling tired over the last few days, feeling febrile with some chills and decreased appetite. She denies any chest pain, back pain, shortness of breath, nausea, vomiting, headache, cough, productive sputum or diarrhea.   PAST MEDICAL HISTORY:  1. Hypertension.  2. Hyperlipidemia. 3. Osteoporosis.  4. Gastroesophageal reflux disease.  5. Hypothyroidism.  6. Parkinson's disease.   ALLERGIES: Has allergy to adhesive tape and codeine.   SOCIAL HISTORY: Patient is resident at nursing home. She is a retired Engineer, civil (consulting). Denies any tobacco use, alcohol abuse or drug abuse.   FAMILY HISTORY: Both parents are deceased.   HOME MEDICATIONS:  1. Trazodone 50 mg at bedtime.  2. Zetia 10 mg daily.  3. Lipitor 20 mg daily.  4. DuoNebs as needed.  5. Flexeril 5 mg as needed.  6. Aspirin 81 mg daily.  7. Calcium 600 mg daily.   8. Colace 100 mg b.i.d.  9. Detrol 4 mg daily.  10. Effexor 75 mg daily.  11. Fosamax 70 mg weekly.  12. Maxzide 37.5/25 mg daily.  13. Metoprolol 25 mg p.o. twice a day.  14. MiraLax 17 mg at bedtime.  15. Norco 5/325 every 4 to 6 hours as needed.  16. Prilosec 20 mg daily.  17. Sinemet 50/200 mg at bedtime.  18. Synthroid 75 mcg daily.  19. Vitamin D2 1000 daily.   REVIEW OF SYSTEMS: CONSTITUTIONAL: Complains of fever, fatigue, weakness. EYES: Denies blurry vision, double vision or pain. ENT: Denies tinnitus, ear pain, hearing loss. RESPIRATORY: Denies any cough, wheezing, hemoptysis, or dyspnea. CARDIOVASCULAR: Denies any chest pain, edema, arrhythmia, palpitation. GASTROINTESTINAL: Denies nausea, vomiting, diarrhea, abdominal pain. GENITOURINARY: Complains of dysuria. No hematuria. Had right flank pain. ENDOCRINE: Denies polyuria, polydipsia, heat or cold intolerance. Has hypothyroidism. HEMATOLOGY: Denies any anemia, easy bruising, bleeding diathesis. Has history of blood clot which was treated with warfarin, currently off treatment. INTEGUMENTARY: Denies any acne, rash, or lesions. MUSCULOSKELETAL: Has history of Parkinson's disease. Denies any arthritis, gout, back pain, shoulder pain. NEUROLOGIC: Denies any numbness, focal weakness. Complains of generalized weakness. Has history of Parkinson's. PSYCH: Denies any schizophrenia, nervousness or insomnia.   PHYSICAL EXAMINATION:  VITAL SIGNS: Temperature 98, pulse 92, respiratory rate 16, blood pressure 85/54, saturating 96% on oxygen.   GENERAL: Frail, elderly female looks comfortable in no apparent distress.   HEENT: Head atraumatic, normocephalic. Pupils equal, reactive to light. Pink conjunctivae. Anicteric sclerae. Moist oral mucosa.   NECK: Supple.  No thyromegaly. No JVD.   CHEST: Good air entry bilaterally. No wheezing, rales, rhonchi.   CARDIOVASCULAR: S1, S2 heard. No rubs, murmur, gallops.   ABDOMEN: Soft, nontender,  nondistended. Bowel sounds present. Has right CVA tenderness on percussion.   EXTREMITIES: No edema. No clubbing. No cyanosis.   PSYCHIATRIC: Appropriate affect. Awake, alert x3. Intact judgment and insight.  NEUROLOGICAL: Cranial nerves grossly intact. Motor 5/5.   SKIN: No rash. Warm and dry. Normal skin turgor.   LABORATORY, DIAGNOSTIC AND RADIOLOGICAL DATA: Glucose 144, BUN 39, creatinine 2.71, sodium 140, potassium 3.7, chloride 105, CO2 25, troponin 0.04, white blood cells 20.3, hemoglobin 13.4, hematocrit 40.2, platelets 172. Urinalysis white blood cells too numerous to count, +2 leukocytes trace.   ASSESSMENT AND PLAN: This is an 81 year old female presents with:  1. Sepsis, most likely due to pyelonephritis. Patient febrile at the nursing home with leukocytosis and hypotension. Will start patient on Levaquin. Will follow on blood and urine culture. Will continue with IV fluid resuscitation and will adjust antibiotics depending on final result on the cultures  2. Acute renal failure secondary to dehydration. Will continue with IV fluids.  3. Hypotension. This is secondary to volume depletion and sepsis. Patient is clinically dehydrated. Will replete volume initially as I think most of her hypotension is secondary to dehydration and volume depletion.  4. Hypothyroidism. Continue with Synthroid.  5. Parkinson disease. Continue with Sinemet.  6. Hypertension. Will hold hypertensive medications as patient is currently hypotensive.  7. Gastroesophageal reflux disease. Continue with PPI.  8. CODE STATUS: Patient is DO NOT RESUSCITATE. Discussed with the patient. As well patient has DO NOT RESUSCITATE sheet from the nursing home.   TOTAL TIME SPENT ON PATIENT CARE: 50 minutes.   ____________________________ Starleen Armsawood S. Vasilisa Vore, MD dse:cms D: 02/28/2012 02:57:43 ET T: 02/28/2012 07:54:33 ET JOB#: [id number]  cc: Starleen Armsawood S. Tyeesha Riker, MD, <Dictator> Yetta FlockAileen H. Miller, MD Maleea Camilo Teena IraniS Diara Chaudhari  MD ELECTRONICALLY SIGNED 02/29/2012 0:16

## 2014-11-10 NOTE — Discharge Summary (Signed)
PATIENT NAME:  Abigail [name], Abigail [name] MR#:  [id number] DATE OF BIRTH:  08/09/1923  DATE OF ADMISSION:  02/28/2012 DATE OF DISCHARGE:  03/01/2012  ADMITTING DIAGNOSIS: Fever and altered mental status.   DISCHARGE DIAGNOSES:  1. Fever and altered mental status due to possible sepsis likely due to pyelonephritis. Urine culture shows Escherichia coli.  2. Acute encephalopathy likely due to urinary tract infection, now improved.  3. Acute renal failure, improved with intravenous hydration. Her HCTZ/triamterene is currently on hold.  4. Hypotension. Again, felt to be due to volume depletion and sepsis, now resolved. Blood pressure elevated. Her metoprolol will be restarted.  5. Hypothyroidism.  6. Parkinson's disease.  7. Hypertension.  8. Gastroesophageal reflux disease.  9. Osteoporosis.   PERTINENT LABORATORY, DIAGNOSTIC AND RADIOLOGICAL DATA: BMP: Glucose 70, BUN 30, creatinine 1.54, sodium 142, potassium 3.4, chloride 110, CO2 27, calcium 7.6. LFTs were normal with a low albumin. Magnesium on 08/07 was 1.5. Most recent BMP today shows a glucose of 132, BUN 20, creatinine 1.38, sodium 141, potassium 3.4, chloride 111. CBC on admission was WBC 20.3, hemoglobin 13.4, platelet count 172. WBC count on 08/08 was normal with a 10.1 hemoglobin. Urine culture showed greater than 100,000 Escherichia coli. Blood cultures: No growth at 48 hours.   CONSULTANTS: None.   HOSPITAL COURSE: Please refer to history and physical done by the admitting physician. The patient is an 81 year old female who was a nursing home resident was sent because of altered mental status and fever. The patient was noted to have a WBC count of 20,000 on presentation. Creatinine was noted to be 2.7. The patient also was noted to be hypotensive on presentation and the patient was admitted for urinary tract infection with sepsis. Was given IV fluids and started on IV Levaquin. Once her urine cultures came back, it confirmed that she had  Escherichia coli urinary tract infection sensitive to quinolones. She will be continued on Cipro. She is doing clinically much better and is stable to return back to her skilled nursing facility at discharge. At this time, the patient's Maxzide is stopped due to elevated creatinine. In the next few days if blood pressure starts increasing, can resume Maxzide.   DISCHARGE MEDICATIONS:  1. Continue vitamin D 1,000 units 1 tab p.o. daily.  2. Aspirin 81 mg 1 tab p.o. daily.  3. Sinemet 5/200, one tab p.o. at bedtime.  4. MiraLAX 17 grams p.o. at bedtime. 5. Calcium 600, one tab p.o. [name].i.d. with meals.  6. Metoprolol tartrate 25 mg 1 tab p.o. [name].i.d.  7. Colace 100, one tab p.o. [name].i.d.  8. Synthroid 75 mcg daily.  9. Prilosec 20 daily.  10. Fosamax 70 mg q. weekly.  11. Effexor-XR 75 p.o. daily.  12. Flexeril 5 mg as needed. 13. Lipitor 20 at bedtime.  14. Norco 5/325 q.4-6 p.r.n. pain.  15. DuoNebs as needed.  16. Trazodone 50, one tab p.o. at bedtime.  17. Zyrtec 10 daily.  18. Desonide 0.05% topical lotion apply to affected area as needed. 19. Ketoconazole 2% apply to affected area [name].i.d.  20. Cipro 500 mg 1 tab p.o. q.12 x4 days.  21. Continue Detrol LA 4 mg p.o. daily.   ACTIVITY: As tolerated with fall precautions.   FOLLOWUP: Followup with Dr. Silver HugueninAileen Miller in 2 to 4 weeks.   DIET: Low sodium, low fat, low cholesterol.       TIME SPENT ON DISCHARGE: 35 minutes.  ____________________________ Abigail ScottsShreyang H. Abigail KatzPatel, MD shp:ap D: 03/01/2012 13:20:39 ET T:  03/01/2012 13:35:31 ET JOB#: [id number]  cc: Macaulay Reicher H. Abigail Katz, MD, <Dictator> Abigail Flock, MD Abigail Carwin MD ELECTRONICALLY SIGNED 03/02/2012 10:51

## 2014-11-10 NOTE — Discharge Summary (Signed)
PATIENT NAME:  Abigail [name], Verl B MR#:  [id number] DATE OF BIRTH:  1924/02/16  DATE OF ADMISSION:  06/30/2012 DATE OF DISCHARGE:  07/01/2012  NOTE: I already did the discharge summary. The admission diagnosis should be facial swelling.   ADMISSION DIAGNOSES:  1. Facial swelling.  2. Extended-spectrum beta-lactamase urinary tract infection.   DISCHARGE DIAGNOSES: 1. Right facial swelling. 2. Extended-spectrum beta-lactamase urinary tract infection.  3. History of coronary artery disease.  4. Chronic lower back pain.   CONSULTANTS: None.   PERTINENT LABORATORY: Sodium 143, potassium 3.7, chloride 109, bicarbonate 25, BUN 11, creatinine 1.15, and glucose 77. White blood cells 5.7, hemoglobin 13.5, hematocrit 41, and platelets 175.   Urine culture shows no growth.   HOSPITAL COURSE: This is an 81 year old female who presented with facial swelling thought to be secondary to an allergic reaction.  1. Facial swelling. This is not thought to be secondary to allergic reaction or dental abscess. Her facial swelling has improved.  2. ESBL urinary tract infection. The patient was initially started on Cipro, just change to Invanz at the nursing home, which she will continue.  3. Coronary artery disease. Continue on aspirin and metoprolol.  4. Chronic back pain. It seems the patient is wheelchair bound.   DISCHARGE MEDICATIONS: 1. Artificial Tears one drop both eyes twice a day. 2. Cyclobenzaprine 5 mg every eight hours p.r.n. pain.  3. Fosamax 70 mg weekly, on Saturday.  4. Ketoconazole 2% apply to affected area twice a day for rash.  5. Metoprolol 25 mg twice a day.  6. MiraLax 17 grams at bedtime.  7. Synthroid 75 mcg daily.  8. Sinemet CR 50/200 mg daily.  9. Pataday 0.2% ophthalmic solution one drop in each eye for itching. 10. Norco 5/325 mg two tablets every four hours p.r.n. pain.  11. Effexor 150 mg daily.  12. Trazodone 100 mg at bedtime.  13. Aspirin 81 mg daily.  14. Calcium  carbonate 1 tablet twice a day. 15. Colace 100 mg twice a day.  16. Prilosec 20 mg twice a day. 17. Vitamin D3 1000 international units daily.  18. Invanz 1 gram daily for six days.   DISCHARGE DIET: Low sodium.   DISCHARGE DIET: Regular consistency.   DISCHARGE REFERRAL: PT as tolerated.   DISCHARGE FOLLOWUP: The patient can follow up with Dr. Lorie PhenixNancy Maloney in one week.  TIME SPENT: 35 minutes. ____________________________ Janyth ContesSital P. Juliene PinaMody, MD spm:slb D: 07/01/2012 11:58:53 ET     T: 07/01/2012 12:11:28 ET       JOB#: [id number] cc: Masako Overall P. Juliene PinaMody, MD, <Dictator> Leo GrosserNancy J. Maloney, MD Janyth ContesSITAL P Tomoki Lucken MD ELECTRONICALLY SIGNED 07/01/2012 12:26

## 2014-11-10 NOTE — H&P (Signed)
PATIENT NAME:  Abigail [name], BYLAND MR#:  [id number] DATE OF BIRTH:  Feb 02, 1924  DATE OF ADMISSION:  06/30/2012  ADMITTING PHYSICIAN: Enid Baas, MD   PRIMARY MD: Lorie Phenix, MD at [facility]    CHIEF COMPLAINT: Right facial swelling.   HISTORY OF PRESENT ILLNESS: [name] is an 81 year old pleasant Caucasian female with past medical history significant for coronary artery disease status post bypass graft surgery, osteoporosis, chronic low back pain with radiation to both legs, gastroesophageal reflux disease, hypothyroidism, and Parkinson's disease who was brought in from [facility] nursing home today secondary to new onset right facial swelling that started this morning. The patient was just started on Invanz intramuscular antibiotic yesterday for ESBL Escherichia coli in her urine cultures. Initially it was thought that she had an allergic reaction. In the ED she has mild swelling of her right cheek around the bony area, nonerythematous and nontender at this time. Plain CT of the maxillofacial area did not reveal any acute abnormalities. Her tongue is normal. She does not have any hives. Less likely to be an allergic reaction. Family is very concerned sending her back to [facility] at this time. She has poor dentition. No evidence of any dental abscess. No fever or white count. On further questioning, the patient did mention that she had a fall on Friday, which was two days ago, that she slid and held onto the wall but the right side of her face hit the wall. Since then she feels like it's heavy on that side so I think this is probably related to trauma at this point so I agree for observation in the hospital and if it does not get worse probably discharge back to [facility].   PAST MEDICAL HISTORY:  1. Coronary artery disease, status post bypass graft surgery in 2006.  2. Hypertension.  3. Hyperlipidemia.  4. Gastroesophageal reflux disease.  5. Osteoporosis.   6. Hypothyroidism.  7. History of DVT, on Coumadin in 2011.  8. History of zoster.  9. Hepatitis C.  10. Chronic low back pain with sciatica, very unsteady walking.  PAST SURGICAL HISTORY:  1. Coronary artery bypass graft surgery.  2. Hysterectomy.  3. Appendectomy.  4. Cataract surgery bilaterally.   ALLERGIES: Codeine, adhesive tape, and NSAIDs, Sudafed, and Premarin.    HOME MEDICATIONS:  1. Artificial tears ophthalmic solution one drop both eyes twice a day for dry eyes.  2. Aspirin 81 mg p.o. daily.  3. Calcium carbonate 600 mg p.o. b.i.d.  4. Colace 100 mg p.o. b.i.d.   5. Flexeril 5 mg p.o. q.8 hours p.r.n.  6. Effexor 150 mg daily.  7. Fosamax 70 mg q. weekly on Saturday.  8. Ketoconazole 2% topical cream apply to affected area twice a day for rash.  9. Metoprolol 25 mg p.o. b.i.d.  10. MiraLAX powder 17 grams once a day p.r.n. for constipation.  11. Norco 5/325 1 to 2 tablets every four hours as needed for pain.  12. Pataday 0.2% ophthalmic solution one drop both eyes daily for itching.  13. Prilosec 20 mg p.o. b.i.d.  14. Sinemet CR 50 mg/200 mg extended-release tablet 1 tablet once a day.  15. Synthroid 75 mcg p.o. daily.  16. Trazodone 100 mg p.o. daily.  17. Vitamin D3 1000 international units p.o. daily.   SOCIAL HISTORY: The patient has been a resident at the [facility] nursing home for about 2-1/2 years now. No history of any smoking or alcohol use. She is a  retired Engineer, civil (consulting)nurse.   FAMILY HISTORY: Both parents had hypertension. Siblings with coronary artery disease.  REVIEW OF SYSTEMS: CONSTITUTIONAL: No fever, fatigue, or weakness. EYES: Positive for blurred vision. Uses glasses and also had cataracts. No glaucoma or inflammation. ENT: No tinnitus, ear pain, hearing loss, epistaxis, or discharge. RESPIRATORY: No cough, wheeze, hemoptysis, or COPD. CARDIOVASCULAR: No chest pain, orthopnea, edema, arrhythmia, palpitations, or syncope. GI: No nausea, vomiting,  abdominal pain, hematemesis, or melena. GU: No dysuria, hematuria, renal calculus, frequency, or incontinence. ENDOCRINE: No polyuria, nocturia, thyroid problems, heat or cold intolerance. HEMATOLOGY: No anemia, easy bruising or bleeding. SKIN: No acne, rash, or lesions. MUSCULOSKELETAL: Positive for low back pain and also leg pain. No gout. NEUROLOGIC: No CVA, TIA, or seizures. PSYCHOLOGICAL: No anxiety, insomnia, or depression.   PHYSICAL EXAMINATION:    VITAL SIGNS: Temperature 98.3 degrees Fahrenheit, pulse 73, respirations 18, blood pressure 156/80, pulse oximetry 95% on room air.   GENERAL: Well built, well nourished female lying in bed not in any acute distress.   HEENT: Normocephalic, atraumatic. The right cheekbone is slightly swollen when compared to the left, nonerythematous and nontender. Pupils equal, round, reacting to light. Pupils are postsurgical. Anicteric sclerae. Extraocular movements intact. Oropharynx clear without erythema, mass, or exudates. No dental infection identified.   NECK: Supple. No thyromegaly, JVD, or carotid bruits. No lymphadenopathy.   LUNGS: Moving air bilaterally. No wheeze or crackles. No use of accessory muscles.   CARDIOVASCULAR: S1, S2 regular rate and rhythm. No murmurs, rubs, or gallops.   ABDOMEN: Soft, nontender, nondistended. No hepatosplenomegaly. Normal bowel sounds.   EXTREMITIES: No pedal edema. No clubbing or cyanosis. 2+ dorsalis pedis pulses palpable on the right side and 1+ on the left side. Left foot grade II has possible paronychia and also erythema at the distal part of the toe which is tender to touch as well.   LYMPHATIC: No cervical lymphadenopathy.   SKIN: No acne, rash, or lesions.   NEUROLOGIC: Cranial nerves intact. No focal motor or sensory deficit.   PSYCHOLOGIC: The patient is awake, alert, oriented x3.   LABORATORY DATA: WBC 6.6, hemoglobin 13.4, hematocrit 40.3, platelet count 181, sodium 141, potassium 4.6, chloride  109, bicarb 26, creatinine 0.91, glucose 76, calcium 8.8, ALT 7, AST 27, alkaline phosphatase 94, total bilirubin 0.5, albumin 3.1. Urinalysis with 1+ leukocyte esterase, 11 WBCs, no bacteria.   CT of maxillofacial area showing no fracture. Paranasal sinuses are well aerated. Globes are intact.   EKG showing normal sinus rhythm, heart rate of 76.   ASSESSMENT AND PLAN: This is an 81 year old female with past medical history of coronary artery disease status post bypass graft surgery, Parkinson's disease, arthritis, reflux, osteoporosis, and hypertension who is brought in from nursing home secondary to right facial swelling.   1. Right facial swelling. Initially it was thought to be an allergic reaction to Invanz versus dental abscess, however, on further questioning and exam I feel that it is secondary to trauma as she hit the right side of her face to the wall two days ago. The swelling is nonerythematous, nontender, and there is no white count or fever, less likely to be abscess. She is on Invanz which will cover anaerobic organisms and gram-negative coverage in the mouth. According to family's wishes, will observe her overnight in the hospital. If there is no worsening, she probably will be discharged back to [facility] tomorrow.  2. ESBL urinary tract infection. She was initially on Cipro b.i.d. but the culture results at  the nursing home came back yesterday as ESBL Escherichia coli so was started on Invanz intramuscular daily for seven days. She received one dose yesterday. Will continue that for now.  3. Coronary artery disease status post bypass graft surgery, appears stable. No chest pain. Continue aspirin and metoprolol.  4. Hypertension. On metoprolol.  5. Chronic low back pain. Use pain medications as needed. She is wheelchair bound and needs assistance. Will be discharged back to [facility] whenever she is ready.  6. Depression. Continue Effexor.  7. Hypothyroidism. Continue  Synthroid.  8. Parkinson's disease. She is on Sinemet.  9. GI and DVT prophylaxis. On Prilosec and Lovenox.   CODE STATUS: DO NOT RESUSCITATE based on the notes sent from [facility].     TIME SPENT ON ADMISSION: 50 minutes.   ____________________________ Enid Baas, MD rk:drc D: 06/30/2012 18:19:38 ET T: 07/01/2012 06:17:48 ET JOB#: [id number]  cc: Enid Baas, MD, <Dictator> Leo Grosser, MD Enid Baas MD ELECTRONICALLY SIGNED 07/09/2012 14:59

## 2014-11-10 NOTE — Discharge Summary (Signed)
PATIENT NAME:  Abigail [name], Abigail [name] MR#:  [id number] DATE OF BIRTH:  Jan 19, 1924  DATE OF ADMISSION:  06/30/2012 DATE OF DISCHARGE:  07/01/2012  ADMISSION DIAGNOSIS: Right facial swelling.   DISCHARGE DIAGNOSES:  1. Right facial swelling secondary to mechanical fall.  2. History of extended-spectrum beta-lactamase urinary tract infection.   LABORATORY, DIAGNOSTIC AND RADIOLOGICAL DATA: White blood cells 5.7, hemoglobin 13.5, hematocrit 40.4, platelets 175, sodium 143, potassium 3.7, chloride 109, bicarbonate 11, creatinine 1.15, glucose 75, magnesium 1.8. CT Maxillofacial showed no evidence of fracture.   HOSPITAL COURSE: This is a very pleasant 81 year old female who is wheelchair bound who presented with facial swelling. For further details, please refer to Dr. Prudencio PairKalisetti's history and physical.  1. Facial swelling. Patient was noted to have a recent fall which is the likely etiology of her facial swelling. She does not have any issues as far as swallowing or eating. She no acute fractures of her face per the CT scan. We do not believe that this is an allergic reaction to her recently started medication on Invanz. 2. History of ESBL urinary tract infection. Patient was started on Invanz which she should continue for six more days.  3. Hypothyroidism. Patient was continued on Synthroid.  4. Gastroesophageal reflux disease. Patient was continued on PPI.   DISCHARGE MEDICATIONS:  1. Norco 5/325, 2 tablets q.4 hours p.r.n. pain.  2. Calcium carbonate 600 [name].i.d.  3. Trazodone 100 mg at bedtime.  4. Effexor 150 mg daily.  5. Sinemet 1 tablet daily.  6. Aspirin 81 mg daily.  7. Metoprolol 25 mg [name].i.d.  8. Fosamax 70 mg once a week on Saturdays.  9. Ketoconazole 2% topical cream [name].i.d. for rash.  10. Colace 100 [name].i.d.  11. MiraLax 17 grams in 8 ounces of water daily.  12. Cyclobenzaprine 5 mg q.8 hours p.r.n. pain.  13. Artificial tears 1 drop to both eyes twice a day.  14. Pataday 0.2% ophthalmic  solution one drop in each eye for itching.  15. Prilosec 20 mg daily.  16. Synthroid 75 mcg daily.  17. Vitamin D3 1000 international units daily.  18. Invanz 1 gram IV daily.   DISCHARGE DIET: Low sodium diet.   DISCHARGE ACTIVITY: As tolerated.   CONDITION ON DISCHARGE: Patient is stable for discharge back to her facility.   DISCHARGE INSTRUCTIONS: She will need to continue on the antibiotics that she had already been on for her ESBL urinary tract infection.   TIME SPENT: 35 minutes.   ____________________________ Janyth ContesSital P. Juliene PinaMody, MD spm:cms D: 07/01/2012 08:31:34 ET T: 07/01/2012 08:50:42 ET JOB#: [id number]  cc: Harlen Danford P. Juliene PinaMody, MD, <Dictator> Leo GrosserNancy J. Maloney, MD Janyth ContesSITAL P Hamdi Vari MD ELECTRONICALLY SIGNED 07/01/2012 11:04

## 2014-11-14 NOTE — Discharge Summary (Signed)
PATIENT NAME:  Abigail [name], Abigail [name] MR#:  [id number] DATE OF BIRTH:  [birth year]  DATE OF ADMISSION:  02/14/2014 DATE OF DISCHARGE:  02/17/2014   Addendum  The patient was planned to be discharged on 02/15/2014. However, she, at some point, had symptoms of right-sided weakness and was noted to have some slurred speech. I obtained an MRI which was consistent with small bilateral lacunar infarcts. She underwent stroke workup, including a 2-D echocardiogram which showed EF of 60% to 65%. No cardiac source of embolism is identified. She also had carotid Dopplers with less than 50% stenosis bilaterally. Neurology did see the patient in consultation. She was continued on aspirin. Dr. Katrinka BlazingSmith from neurology recommended this for 3 months in addition to Plavix 75 mg daily, which she will take lifelong. She needs PT, OT, speech at discharge. Speech did come and see the patient during the hospitalization. She is currently on a mechanical soft diet with aspiration precautions and pureed meats.   Urinary tract infection was positive for E. coli, pansensitive. She will be discharged with Keflex.   DISCHARGE MEDICATIONS:  1. Artificial tears [name].i.d. p.r.n.  2. Fosamax 70 mg on Saturdays. 3. Metoprolol 25 mg [name].i.d.  4. MiraLax 17 grams daily.  5. Sinemet 50/200 daily.  6. Synthroid 75 mcg daily.  7. Effexor 150 mg daily.  8. Calcium 600 mg [name].i.d.   9. Prilosec 20 mg [name].i.d.  10. Hydrochlorothiazide 12.5 mg daily.  11. Vitamin D3 1000 international units daily. 12. Trazodone 50 mg at bedtime.  13. Oxybutynin 5 mg [name].i.d.  14. Colace 100 mg q.12 hours.  15. Pataday ophthalmic solution p.r.n.  16. Norco 5/325 one to two tablets q.4 hours p.r.n. pain.  17. Lisinopril 40 mg daily.  18. Aspirin 81 mg daily for 3 months, stop 05/17/2014.  19. Keflex 500 mg p.o. t.i.d. for 8 days.  20. Plavix 75 mg daily.   DISCHARGE DIET: Low sodium, mechanical soft with pureed meats and aspiration precautions.   REFERRAL: PT, OT,  speech.   DISCHARGE INSTRUCTIONS: The patient needs daily blood pressure monitoring, goal less than 130/80. The patient's blood pressure medications should reflect this.  Plan of care was discussed with the son.   TIME SPENT: Approximately 31 minutes.   ____________________________ Janyth ContesSital P. Juliene PinaMody, MD spm:lb D: 02/17/2014 09:12:42 ET T: 02/17/2014 09:23:26 ET JOB#: [id number]  cc: Liddie Chichester P. Juliene PinaMody, MD, <Dictator> Leo GrosserNancy J. Maloney, MD Janyth ContesSITAL P Maryalyce Sanjuan MD ELECTRONICALLY SIGNED 02/17/2014 12:44

## 2014-11-14 NOTE — Consult Note (Signed)
Referring Physician:  Aldean Jewett :   Primary Care Physician:  Aldean Jewett : [facility], [address] [place], [place] [address], [place] [phone]  Reason for Consult: Admit Date: 15-Feb-2014  Chief Complaint: altered mental status  Reason for Consult: altered mental status   History of Present Illness: History of Present Illness:   81 yo RHD F presents from nursing home due to altered mental status.  Pt was found to have an UTI which once was treated pt improved.  MRI was ordered on pt which showed two infarcts so neurology was consulted.  Pt is a normal NH resident and likely close to her baseline with dementia.  ROS:  Review of Systems   unobtainable secondary to dementia  Past Medical/Surgical Hx:  Constipation:   Chronic Kidney Disease:   Hyperlipidemia:   Hypothyroidism:   Pyelonephritis:   Sepsis:   Bronchitis:   depression:   dvt:   kidney dz:   glaucoma:   reflux:   osteoporosis:   L1 compression fx:   syncope:   Hypertension:   Hepatitis B:   CABG (Coronary Artery Bypass Graft):   Past Medical/ Surgical Hx:  Past Medical History reviewed by me as above   Past Surgical History reviewed by me as above   Home Medications: Medication Instructions Last Modified Date/Time  cephalexin 500 mg oral capsule 1 cap(s) orally 3 times a day x 8 days 26-Jul-15 08:58  Norco 5 mg-325 mg oral tablet 1-2 tab(s) orally every 4 hours, As Needed - for Pain 26-Jul-15 08:58  lisinopril 20 mg oral tablet 1 tab(s) orally once a day 26-Jul-15 08:58  Artificial Tears ophthalmic solution 1 drop(s) to each eye 2 times a day, As Needed for dry eyes 25-Jul-15 17:46  Fosamax 70 mg oral tablet 1 tab(s) orally once a week on Saturday (0630) 25-Jul-15 17:46  metoprolol tartrate 25 mg oral tablet 1 tab(s) orally 2 times a day (0600, 1700) 25-Jul-15 17:46  MiraLax oral powder for reconstitution 17 gram(s) in 8oz of water and drink orally once a day (at bedtime)  for constipation (2100) 25-Jul-15 17:46  Sinemet CR 50 mg-200 mg oral tablet, extended release 1 tab(s) orally once a day (0800) 25-Jul-15 17:46  Synthroid 75 mcg (0.075 mg) oral tablet 1 tab(s) orally once a day (0800) 25-Jul-15 17:46  Effexor XR 150 mg oral capsule, extended release 1 cap(s) orally once a day (0800) 25-Jul-15 17:46  aspirin 81 mg oral tablet 1 tab(s) orally once a day (0800) 25-Jul-15 17:46  calcium (as carbonate) 600 mg oral tablet 1 tab(s) orally 2 times a day for nutritional supplement (0800, 1700) 25-Jul-15 17:46  Prilosec 20 mg oral delayed release capsule 1 cap(s) orally 2 times a day (0600, 1600) 25-Jul-15 17:46  Vitamin D3 1000 intl units oral tablet 1 tab(s) orally once a day for nutritional supplement (0800) 25-Jul-15 17:46  hydrochlorothiazide 12.5 mg oral tablet 1 tab(s) orally once a day for HTN (0800) 25-Jul-15 17:46  traZODone 50 mg oral tablet 1 tab(s) orally once a day (at bedtime) for insomnia (2100) 25-Jul-15 17:46  oxybutynin 5 mg oral tablet 1 tab(s) orally 2 times a day for urinary frequency (0800, 1700) 25-Jul-15 17:46  Colace 100 mg oral capsule 1 cap(s) orally every 12 hours, As Needed - for Constipation 25-Jul-15 17:46  Pataday 0.2% ophthalmic solution 1 drop(s) to each eye 2 times a day, As Needed for itching 25-Jul-15 17:46   Allergies:  adhedsive tape  give  rash; can  use paper tape: Rash  Codeine: Other  Other- Explain in Comments Line: Other  Celebrex: Unknown  Premarin: Unknown  Sudafed: Unknown  NSAIDS: Unknown  Allergies:  Allergies see above   Social/Family History: Employment Status: retired  Lives With: alone  Living Arrangements: assisted living  Social History: no tob, no EtOH, no illicits  Family History: no strokes or seizures   Vital Signs: **Vital Signs.:   27-Jul-15 11:40  Vital Signs Type Routine  Temperature Temperature (F) 98  Celsius 36.6  Temperature Source oral  Pulse Pulse 66  Respirations Respirations 17   Systolic BP Systolic BP 751  Diastolic BP (mmHg) Diastolic BP (mmHg) 78  Mean BP 110  Pulse Ox % Pulse Ox % 98  Pulse Ox Activity Level  At rest  Oxygen Delivery Room Air/ 21 %   Physical Exam: General: nl weight, NAD  HEENT: normocephalic, sclera nonicteric, oropharynx clear  Neck: supple, no JVD, no bruits  Chest: CTA B, no wheezing, good movement  Cardiac: RRR, no murmurs, no edema, 2+ pulses  Extremities: no C/C/E, FROM   Neurologic Exam: Mental Status: alert but oriented only to person, good naming and follows simple commands  Cranial Nerves: PERRLA, EOMI, nl VF, face symmetric, tongue midline, shoulder shrug equal  Motor Exam: 4/5 R, 5 /5 L, nl tone, bradykinesia  Deep Tendon Reflexes: 1+/4 B, mute plantars B  Sensory Exam: inconsistent  Coordination: nl F to N   Lab Results: LabObservation:  27-Jul-15 10:06   OBSERVATION Reason for Test  Hepatic:  25-Jul-15 16:19   Bilirubin, Total 0.4  Alkaline Phosphatase 71 (46-116 NOTE: New Reference Range 02/10/14)  SGPT (ALT) 19 (14-63 NOTE: New Reference Range 02/10/14)  SGOT (AST) 17  Total Protein, Serum 6.9  Albumin, Serum  3.1  Routine Micro:  25-Jul-15 15:32   Organism Name Escherichia coli  Organism Quantity >100,000 CFU/ML  Nitrofurantoin Sensitivity S  Cefazolin Sensitivity S  Ampicillin Sensitivity S  Ceftriaxone Sensitivity S  Ciprofloxacin Sensitivity S  Gentamicin Sensitivity S  Imipenem Sensitivity S  Levofloxacin Sensitivity S  Trimethoprim/Sulfamethoxazole Sensitivty S  Micro Text Report URINE CULTURE   ORGANISM 1                >100,000 CFU/ML Escherichia coli   COMMENT                   WITH MIXED-BACTERIAL-ORGANISMS   ANTIBIOTIC                    ORG#1     AMPICILLIN                    S         CEFAZOLIN S         CEFOXITIN                     S         CEFTRIAXONE                   S         CIPROFLOXACIN                 S         GENTAMICIN                    S         IMIPENEM  S         LEVOFLOXACIN                  S      NITROFURANTOIN                S         Trimethoprim/Sulfamethoxazole S  Specimen Source CLEAN CATCH  Organism 1 >100,000 CFU/ML Escherichia coli  Culture Comment WITH MIXED-BACTERIAL-ORGANISMS  Result(s) reported on 16 Feb 2014 at 08:46AM.  Routine Chem:  25-Jul-15 15:32   Result Comment PT/PTT/METC/TROPONIN - SPECIMEN GROSSLY HEMOLYZED. CALLED  Mateo Flow, RN FOR RECOLLECT  Result(s) reported on 14 Feb 2014 at 04:03PM.  26-Jul-15 05:33   Glucose, Serum 98  BUN 13  Creatinine (comp) 0.89  Sodium, Serum 141  Potassium, Serum 3.8  Chloride, Serum  108  CO2, Serum 25  Calcium (Total), Serum 8.9  Anion Gap 8  Osmolality (calc) 281  eGFR (African American) >60  eGFR (Non-African American)  58 (eGFR values <40mL/min/1.73 m2 may be an indication of chronic kidney disease (CKD). Calculated eGFR is useful in patients with stable renal function. The eGFR calculation will not be reliable in acutely ill patients when serum creatinine is changing rapidly. It is not useful in  patients on dialysis. The eGFR calculation may not be applicable to patients at the low and high extremes of body sizes, pregnant women, and vegetarians.)  Cardiac:  25-Jul-15 16:19   Troponin I < 0.02 (0.00-0.05 0.05 ng/mL or less: NEGATIVE  Repeat testing in 3-6 hrs  if clinically indicated. >0.05 ng/mL: POTENTIAL  MYOCARDIAL INJURY. Repeat  testing in 3-6 hrs if  clinically indicated. NOTE: An increase or decrease  of 30% or more on serial  testing suggests a  clinically important change)  Routine UA:  25-Jul-15 15:32   Color (UA) Amber  Clarity (UA) Clear  Glucose (UA) Negative  Bilirubin (UA) Negative  Ketones (UA) Negative  Specific Gravity (UA) 1.012  Blood (UA) Negative  pH (UA) 7.0  Protein (UA) 30 mg/dL  Nitrite (UA) Negative  Leukocyte Esterase (UA) 2+ (Result(s) reported on 14 Feb 2014 at 04:03PM.)  RBC (UA) 3 /HPF  WBC (UA)  77 /HPF  Bacteria (UA) NONE SEEN  Epithelial Cells (UA) 1 /HPF (Result(s) reported on 14 Feb 2014 at 04:03PM.)  Routine Coag:  25-Jul-15 16:19   Activated PTT (APTT)  41.0 (A HCT value >55% may artifactually increase the APTT. In one study, the increase was an average of 19%. Reference: "Effect on Routine and Special Coagulation Testing Values of Citrate Anticoagulant Adjustment in Patients with High HCT Values." American Journal of Clinical Pathology 2006;126:400-405.)  Prothrombin 12.8  INR 1.0 (INR reference interval applies to patients on anticoagulant therapy. A single INR therapeutic range for coumarins is not optimal for all indications; however, the suggested range for most indications is 2.0 - 3.0. Exceptions to the INR Reference Range may include: Prosthetic heart valves, acute myocardial infarction, prevention of myocardial infarction, and combinations of aspirin and anticoagulant. The need for a higher or lower target INR must be assessed individually. Reference: The Pharmacology and Management of the Vitamin K  antagonists: the seventh ACCP Conference on Antithrombotic and Thrombolytic Therapy. LMBEM.7544 Sept:126 (3suppl): [id number]. A HCT value >55% may artifactually increase the PT.  In one study,  the increase was an average of 25%. Reference:  "Effect on Routine and Special Coagulation Testing Values of Citrate Anticoagulant Adjustment in Patients with High HCT Values." American Journal of  Clinical Pathology 5621;308:657-846.)  Routine Hem:  26-Jul-15 05:33   WBC (CBC) 8.3  RBC (CBC) 5.06  Hemoglobin (CBC) 15.2  Hematocrit (CBC) 45.9  Platelet Count (CBC) 173  MCV 91  MCH 30.0  MCHC 33.1  RDW  15.2  Neutrophil % 76.2  Lymphocyte % 12.5  Monocyte % 8.7  Eosinophil % 1.7  Basophil % 0.9  Neutrophil # 6.4  Lymphocyte # 1.0  Monocyte # 0.7  Eosinophil # 0.1  Basophil # 0.1 (Result(s) reported on 15 Feb 2014 at [facility].)   Radiology Results: [place]:     26-Jul-15 18:08, US Carotid Doppler Bilateral  US Carotid Doppler Bilateral   REASON FOR EXAM:    stroke  COMMENTS:       PROCEDURE: [place]  - US CAROTID DOPPLER BILATERAL  - Feb 15 2014  6:08PM     CLINICAL DATA:  Stroke    EXAM:  BILATERAL CAROTID DUPLEX ULTRASOUND    TECHNIQUE:  Pearline Cables scale imaging, color Doppler and duplex ultrasound were  performed of bilateral carotid and vertebral arteries in the neck.    COMPARISON:  None.  FINDINGS:  Criteria: Quantification of carotid stenosis is based on velocity  parameters that correlate the residual internal carotid diameter  withNASCET-based stenosis levels, using the diameter of the distal  internal carotid lumen as the denominator for stenosis measurement.    The following velocity measurements were obtained:    RIGHT    ICA:  54/14 cm/sec    CCA:  96/2 cm/sec    SYSTOLIC ICA/CCA RATIO:  1.1  DIASTOLIC ICA/CCA RATIO:  1.7    ECA:  Peak systolic velocity of 952 cm/sec    LEFT    ICA:  89/18 cm/sec    CCA:  84/13 cm/sec    SYSTOLIC ICA/CCA RATIO:  1.5    DIASTOLIC ICA/CCA RATIO:  1.6    ECA:  Peak systolic velocity of 24MW/NUU  RIGHT CAROTID ARTERY: Mild plaque right carotid bulb with moderate  plaque proximal external carotid artery    RIGHT VERTEBRAL ARTERY:  Antegrade flow    LEFT CAROTID ARTERY: Mild plaque left internal carotid artery  proximally and left carotid bulb as well as into the proximal left  external carotid artery.    LEFT VERTEBRAL ARTERY:  Antegrade flow     IMPRESSION:  Based done flow rates and ratios, less than 50% narrowing  bilaterally.  Electronically Signed    By: Skipper Cliche M.D.   On: 02/15/2014 18:34         Verified By: Rachael Fee, M.D.,  MRI:    26-Jul-15 13:19, MRI Brain Without Contrast  MRI Brain Without Contrast   REASON FOR EXAM:    cva  COMMENTS:       PROCEDURE: MR  - MR BRAIN WO CONTRAST  - Feb 15 2014  1:19PM     ADDENDUM REPORT: 02/15/2014  14:49    ADDENDUM:  Also noted is an abnormality in the region of the pituitary stalk  measuring 6 x 6 x 14 mm (AP by transverse by craniocaudal), grossly  unchanged from 2011 MRI. This is T2 hyperintense with intermediate  T1 signal intensity. It is not clear if this represents pathologic  enlargement of the pituitary stalk itself versus an adjacent  sellar/suprasellar mass, such as of pituitary origin, or  proteinaceous Rathke's cleft cyst. Stability over the past 4 years  is suggestive of a benign, indolent process. If further evaluation  is clinically warranted, dedicated pituitary protocol  MRI without  andwith contrast would be recommended.      Electronically Signed    By: Logan Bores    On: 02/15/2014 14:49    CLINICAL DATA:  Confusion for 1 week.  Evaluate for straight.    EXAM:  MRI HEAD WITHOUT CONTRAST    TECHNIQUE:  Multiplanar, multiecho pulse sequences of the brain and surrounding  structures were obtained without intravenous contrast.    COMPARISON:  Head CT 02/14/2014 and MRI 10/04/2009    FINDINGS:  Small foci of restricted diffusion are present in the posterior left  frontal lobe involving cortex and subcortical white matter as well  as in right frontal subcortical white matter, consistent with acute  infarcts. Punctate foci of acute infarction are also present in the  right insula and inferolateral peri-rolandic cortex on the left.  There are several foci of remote microhemorrhage in both cerebral  hemispheres separate from these acute infarcts.    Patchy and confluent T2 hyperintensities in the subcortical and deep  cerebral white matter bilaterally have mildly progressed from the  prior MRI and are nonspecific but compatible with advanced chronic  small vessel ischemic disease. Remote lacunar infarcts are present  in the basal ganglia, right corona radiata, and left centrum  semiovale. Tiny, remote infarcts are also seen in the inferior  cerebellum  bilaterally. There is moderate cerebral atrophy.    Prior bilateral cataract extraction is noted. Paranasal sinuses and  mastoid air cells are clear. Major intracranial vascular flow voids  are preserved.     IMPRESSION:  1. Small areas of acute infarction involving the left greater than  right MCA territories. Query emboli.  2. Advanced chronic small vessel ischemic disease and remote lacunar  infarcts as above.    Electronically Signed:  By: Logan Bores  On: 02/15/2014 13:57         Verified By: Ferol Luz, M.D.,  CT:    25-Jul-15 16:01, CT Head Without Contrast  CT Head Without Contrast   REASON FOR EXAM:    facial droop/slurred speech  COMMENTS:       PROCEDURE: CT  - CT HEAD WITHOUT CONTRAST  - Feb 14 2014  4:01PM     CLINICAL DATA:  Weakness.  Altered mental status.  Slurred speech.    EXAM:  CT HEAD WITHOUT CONTRAST    TECHNIQUE:  Contiguous axial images were obtained from the base of the skull  through the vertex without intravenous contrast.    COMPARISON:  Head CT 11/25/2010.  FINDINGS:  Physiologic calcifications of the basal ganglia bilaterally.  Multiple old lacunar infarcts in the basal ganglia bilaterally,  similar to the prior examination. Patchy and confluent areas of  decreased attenuation are noted throughout the deep and  periventricular white matter of the cerebral hemispheres  bilaterally, compatible with chronic microvascular ischemic disease.  Mild cerebral and cerebellar atrophy. No acute intracranial  abnormalities. Specifically, no evidence of acute intracranial  hemorrhage, no definite findings of acute/subacute cerebral  ischemia, no mass, mass effect, hydrocephalus or abnormal intra or  extra-axial fluid collections. Visualized paranasal sinuses and  mastoids are well pneumatized. No acute displaced skull fractures  are identified.   IMPRESSION:  1. No acute intracranial abnormalities.  2. Mild cerebral and cerebellar atrophy  with multiple old lacunar  infarcts in the basal ganglia bilaterally and severe chronic  microvascular ischemic changes throughout the cerebral white matter,  similar to prior examinations, as above.      Electronically Signed  By: Vinnie Langton M.D.    On: 02/14/2014 16:07         Verified By: Etheleen Mayhew, M.D.,   Radiology Impression: Radiology Impression: MRI of brain personally reviewed by me and shows small acute lacunar infarcts bilateral, severe underlying white matter disease   Impression/Recommendations: Recommendations:   prior notes reviewed by me reviewed by me   Bilateral lacunar infarcts-  no obvious embolic source and these are small and subcortical;  likely these are secondary to uncontrolled small vessel disease (HTN) Encephalopathy-  improved, continue treatment of UTI continue ASA $RemoveBefo'81mg'[id number]$  daily x 3 months then stop start plavix $RemoveBefo'75mg'[id number]$  daily needs better BP management with goal < 130/80 echo pending, if neg please do 60 day event monitor will sign off, please have pt f/u with [facility] in 2-3 months  Electronic Signatures: Jamison Neighbor (MD)  (Signed 27-Jul-15 15:18)  Authored: REFERRING PHYSICIAN, Primary Care Physician, Consult, History of Present Illness, Review of Systems, PAST MEDICAL/SURGICAL HISTORY, HOME MEDICATIONS, ALLERGIES, Social/Family History, NURSING VITAL SIGNS, Physical Exam-, LAB RESULTS, RADIOLOGY RESULTS, Recommendations   Last Updated: 27-Jul-15 15:18 by Jamison Neighbor (MD)

## 2014-11-14 NOTE — Discharge Summary (Signed)
PATIENT NAME:  Abigail [name], Abigail [name] MR#:  [id number] DATE OF BIRTH:  [birth year]  DATE OF ADMISSION:  02/14/2014 DATE OF DISCHARGE:  02/16/2014  ADMISSION DIAGNOSES:  1.  Altered mental status.  2.  Urinary tract infection.   DISCHARGE DIAGNOSES:   1.  Encephalopathy, acute, metabolic secondary to urinary tract infection. 2.  Urinary tract infection.  3.  Accelerated hypertension.  4.  History of coronary artery disease, status post coronary artery bypass grafting.   HOSPITAL COURSE:  The patient is an 81 year old female who presents from [facility] with altered mental status and confusion and urinary tract infection. For further details please see H and P.  1.  Acute metabolic encephalopathy secondary to urinary tract infection. The patient was started on antibiotics for her urinary tract infection.  She is on Rocephin.  She has had Escherichia coli, pansensitive, from her past urine culture from 05/21/2013.  She did well with this regimen and she seems to be back at baseline and will be discharge with Keflex.  2.  Urinary tract infection.  As mentioned above, the patient's mental status is back to her baseline. She is been treated on Rocephin and will be discharged with Keflex. She has a history of Escherichia coli in the past.   3.  Accelerated hypertension.  The patient will need close followup for her blood pressure.  4.  History of CAD. The patient will be continued on her outpatient medications.   DISCHARGE MEDICATIONS:  1.  Artificial tears [name].i.d. p.r.n.  2.  Fosamax 70 mg on Saturdays.  3.  Metoprolol 25 mg [name].i.d.  4.  MiraLax 17 grams daily.  5.  Sinemet 1 tablet daily.  6.  Synthroid 75 mcg daily.  7.  Effexor 150 mg daily.  8.  Aspirin 81 mg daily.  9.  Calcium [name].i.d.  10. Prilosec 20 mg [name].i.d.  11. Vitamin D3 1000 international units daily.  12. HCTZ 12.5 daily.  13. Trazodone 50 mg at bedtime.  14. Oxybutynin 5 mg 2 times a day.  15. Colace 100 mg daily.  16. Pataday  ophthalmic solution [name].i.d. p.r.n.  17. Norco 5/325, 1-2 tablets q.4 hours p.r.n. pain.  18. Lisinopril 20 mg daily.  19. Keflex 500 mg p.o. t.i.d. x 8 days.   DISCHARGE DIET: Low sodium.   DISCHARGE ACTIVITY: As tolerated.   DISCHARGE REFERRAL: Physical therapy.   DISCHARGE COMMENTS:  The patient needs daily blood pressure monitoring.   DISCHARGE FOLLOWUP: The patient follow up in 1 week with Dr. Lorie PhenixNancy Maloney. The patient is stable for discharge. Plan of care was discussed with the patient's daughter.   TIME SPENT: 35 minutes.    ____________________________ Janyth ContesSital P. Juliene PinaMody, MD spm:lt D: 02/15/2014 09:50:00 ET T: 02/15/2014 10:00:17 ET JOB#: [id number]  cc: Manning Luna P. Juliene PinaMody, MD, <Dictator> Janyth ContesSITAL P Camron Essman MD ELECTRONICALLY SIGNED 02/16/2014 13:05

## 2014-11-14 NOTE — H&P (Signed)
PATIENT NAME:  Abigail [name], Abigail [name] MR#:  [id number] DATE OF BIRTH:  Nov 30, 1923  DATE OF ADMISSION:  02/14/2014  PRIMARY CARE PHYSICIAN:  Leo Grosser, MD.  REFERRING EMERGENCY PHYSICIAN:  Sheran Fava. Fanny Bien, MD.  CHIEF COMPLAINT:  Altered mental status.  HISTORY OF PRESENT ILLNESS:  This is an 81 year old female with past medical history of coronary artery disease status post CABG in 2006, hypothyroidism, Parkinson's disease, advanced dementia who presents today from [facility] with weakness and altered mental status.  She was found in the Emergency Room to have a urinary tract infection.  She is unable to give a history of her symptoms.  I have attempted to call her daughter, but was unsuccessful in reaching her.  She is currently without pain, has no acute complaints.    PAST MEDICAL HISTORY: 1.  History of ESBL Escherichia coli  infection in December 2013. 2.  Coronary artery disease status post coronary artery bypass graft in 2006. 3.  Back pain, chronic.  History of wheelchair use. 4.  Osteoporosis. 5.  Hypothyroidism. 6.  Parkinson's disease with dementia. 7.  Gastroesophageal reflux disease. 8.  History of DVT in 2001. 9.  Hepatitis C. 10.  Chronic kidney disease. 11.  Depression. 12.  Insomnia. 13.  Paralysis agitans.    PAST SURGICAL HISTORY: 1.  Coronary artery bypass grafting 2006. 2.  Hysterectomy. 3.  Appendectomy. 4.  Bilateral cataract.  ALLERGIES:   1.  ADHESIVE TAPE. 2.  CELEBREX. 3.  CODEINE. 4.  NONSTEROID ANTIINFLAMMATORIES. 5.  SUDAFED.  HOME MEDICATIONS: 1.  Norco 5/325 one tablet by mouth every 4 hours as needed for pain. 2.  Hydrochlorothiazide 12.5 mg one tablet once daily for hypertension. 3.  Oxybutynin 5 mg 2 times a day for urinary frequency. 4.  Colace capsule 100 mg 1 capsule every 12 hours as needed for constipation. 5.  UTI-Stat liquid.  Give 30 mL by mouth once a day for UTI prevention. 6.  Artificial Tear Solution 1.4% one drop both eyes  as needed for dry eyes. 7.  Pataday Solution 0.2% 1 drop both eyes as needed for itching eyes. 8.  Sinemet CR tablet extended release 50-200 mg 1 tablet by mouth once a day. 9.  Fosamax tablet 70 mg 1 tablet weekly on Saturdays. 10.  Vitamin D 1000 units 1 tablet once a day. 11.  Aspirin 81 mg daily. 12.  Synthroid 75 mcg daily. 13.  Metoprolol 25 mg twice a day. 14.  Prilosec 20 mg twice a day. 15.  MiraLax 17 g at bedtime as needed for constipation. 16.  Calcium tablet 600 mg 1 tablet twice a day. 17.  Trazodone 50 mg at bedtime for insomnia. 18.  Lisinopril 5 mg once a day for hypertension. 19.  Effexor XR capsule extended release 150 mg once a day.  SOCIAL HISTORY:  The patient is a resident of [facility]. She is a nonsmoker.  She does not drink alcohol.  FAMILY HISTORY:  Positive for coronary artery disease and hypertension.  REVIEW OF SYSTEMS:  The patient is unable to perform the review of systems due to altered mental status.  PHYSICAL EXAMINATION: VITAL SIGNS:  Pulse 75, respirations 18, blood pressure 191/93, oxygen saturation 98. GENERAL:  The patient is alert and resting comfortably in the bed. HEENT:  Pupils are constricted, reactive.   Conjunctivae are clear.  There is no icterus.  Extraocular motions are intact.  There is no nasal lesion.  No drainage.  Mucous membranes are dry.  No oral lesions.  Poor dentition.  No exudates. NECK:  Supple and symmetric.  No mass.  Thyroid is nontender.  No thyroid nodules are noted.  There is no JVD. RESPIRATORY:  There are bibasilar crackles, good.  No respiratory distress. CARDIOVASCULAR:  Regular rate and rhythm.  No murmurs, rubs or gallops.  Peripheral pulses are 2+. ABDOMEN:  Abdomen is diffusely tender, nondistended.  There is no hepatosplenomegaly.  Bowel sounds are elevated.  There is no rebound or guarding. MUSCULOSKELETAL:  Extremities move normally.  There is no tenderness or effusion.  Normal range of motion. SKIN:  No  rash, no lesions.  No wounds. LYMPHATICS:  No cervical lymphadenopathy. VASCULAR:  There are good peripheral pulses bilaterally. NEUROLOGIC:  Cranial nerves II-XII are grossly intact.  The patient is not oriented.  Neurologic exam is nonfocal.  RESULTS:  Sodium 143, potassium 4.1, chloride 109, bicarbonate 27, BUN of 15.  Creatinine is 1.03.  Calcium 8.9, bilirubin 0.4, alkaline phosphatase 71,  ALT 19, AST 17.  Total protein 6.9.  Albumin 3.1.  White blood cell count 7.8.  Hemoglobin is 15.7.  Platelets are 184,000.  INR is 1.0.  Urinalysis shows greater than 77 white blood cells.  IMAGING:  CT of the head shows no acute intracranial abnormality, mild cerebral and cerebellar atrophy, multiple old lacunar infarcts.  ASSESSMENT AND PLAN: 1.  Urinary tract infection:  Urine culture is pending, started empirically on Rocephin.  Will adjust antibiotics pending culture results. 2. Hypertension.  Resume home antihypertensives with p.r.n. hydralazine as needed for uncontrolled hypertension.   3.  Altered mental status:  She does have underlying dementia, it is not clear if this is her baseline.  I have attempted to reach her daughter and was unsuccessful.  Will continue to monitor.  Altered mental status is likely worsened by the acute urinary tract infection. 4.  Coronary artery disease status post bypass grafting:  Stable.  There is no chest pain.  Continue aspirin and metoprolol. 5.  Chronic low back pain:  Will continue to use oral pain medication as needed.  She may be evaluated by physical therapy during this admission. 6.  Depression:  Continue Effexor. 7.  Hypothyroidism:  Continue Synthroid. 8.  Parkinson's disease:  Continue Sinemet. 9.  Code Status:  Though [facility], she is noted to be a do not resuscitate in her last admission, the paperwork from today, from [facility] indicated that she is a full code.  I have attempted to reach her daughter to discuss, but was unable to do  so.  This conversation will need to be had as soon as possible with her family.  The patient cannot participate due to altered mental status.  TIME SPENT ON ADMISSION:  50 minutes.   ____________________________ Ena Dawleyatherine P. Clent RidgesWalsh, MD cpw:ds D: 02/14/2014 21:20:49 ET T: 02/14/2014 21:44:34 ET JOB#: [id number]  cc: Santina Evansatherine P. Clent RidgesWalsh, MD, <Dictator> Leo GrosserNancy J. Maloney, MD Gale JourneyATHERINE P Jeramine Delis MD ELECTRONICALLY SIGNED 02/15/2014 14:03

## 2015-02-01 ENCOUNTER — Other Ambulatory Visit: Payer: Self-pay

## 2015-02-01 ENCOUNTER — Emergency Department: Payer: Medicare Other

## 2015-02-01 ENCOUNTER — Emergency Department
Admission: EM | Admit: 2015-02-01 | Discharge: 2015-02-01 | Disposition: A | Discharge status: Home / Self Care | Payer: Medicare Other | Attending: Emergency Medicine | Admitting: Emergency Medicine

## 2015-02-01 ENCOUNTER — Encounter: Payer: Self-pay | Admitting: Emergency Medicine

## 2015-02-01 DIAGNOSIS — R55 Syncope and collapse: Secondary | ICD-10-CM | POA: Diagnosis present

## 2015-02-01 DIAGNOSIS — Z87891 Personal history of nicotine dependence: Secondary | ICD-10-CM | POA: Insufficient documentation

## 2015-02-01 DIAGNOSIS — R4189 Other symptoms and signs involving cognitive functions and awareness: Secondary | ICD-10-CM

## 2015-02-01 HISTORY — DX: Unspecified dementia, unspecified severity, without behavioral disturbance, psychotic disturbance, mood disturbance, and anxiety: F03.90

## 2015-02-01 HISTORY — DX: Alzheimer's disease, unspecified: G30.9

## 2015-02-01 HISTORY — DX: Alzheimer's disease, unspecified: F02.80

## 2015-02-01 LAB — BASIC METABOLIC PANEL
ANION GAP: 7 (ref 5–15)
BUN: 18 mg/dL (ref 6–20)
CALCIUM: 9.6 mg/dL (ref 8.9–10.3)
CO2: 27 mmol/L (ref 22–32)
Chloride: 104 mmol/L (ref 101–111)
Creatinine, Ser: 0.88 mg/dL (ref 0.44–1.00)
GFR, EST NON AFRICAN AMERICAN: 57 mL/min — AB (ref >60)
GLUCOSE: 115 mg/dL — AB (ref 65–99)
Potassium: 3.2 mmol/L — ABNORMAL LOW (ref 3.5–5.1)
Sodium: 138 mmol/L (ref 135–145)

## 2015-02-01 LAB — CBC WITH DIFFERENTIAL/PLATELET
BASOS PCT: 1 %
Basophils Absolute: 0.1 10*3/uL (ref 0–0.1)
Eosinophils Absolute: 0.1 10*3/uL (ref 0–0.7)
Eosinophils Relative: 2 %
HCT: 43 % (ref 35.0–47.0)
Hemoglobin: 14.5 g/dL (ref 12.0–16.0)
LYMPHS PCT: 18 %
Lymphs Abs: 1 10*3/uL (ref 1.0–3.6)
MCH: 30.7 pg (ref 26.0–34.0)
MCHC: 33.7 g/dL (ref 32.0–36.0)
MCV: 91.1 fL (ref 80.0–100.0)
MONO ABS: 0.5 10*3/uL (ref 0.2–0.9)
MONOS PCT: 9 %
NEUTROS PCT: 70 %
Neutro Abs: 4 10*3/uL (ref 1.4–6.5)
Platelets: 155 10*3/uL (ref 150–440)
RBC: 4.72 MIL/uL (ref 3.80–5.20)
RDW: 14.7 % — AB (ref 11.5–14.5)
WBC: 5.7 10*3/uL (ref 3.6–11.0)

## 2015-02-01 NOTE — ED Provider Notes (Signed)
CXR-IMPRESSION: 1. Low lung volumes without radiographic evidence of acute cardiopulmonary disease. 2. Atherosclerosis.  ----------------------------------------- 9:02 PM on 02/01/2015 -----------------------------------------  Patient remains at baseline mental status.  Maurilio LovelyNoelle Naol Ontiveros, MD 02/01/15 2103

## 2015-02-01 NOTE — ED Notes (Signed)
Pt to ED via EMS from [facility] c/o syncopal episode.  Per EMS staff members found pt unresponsive in chair watching TV.  Per EMS pt back to baseline now.  Pt alert, soft spoken, and in NAD at this time.

## 2015-02-01 NOTE — Discharge Instructions (Signed)
Follow up with your primary care doctor tomorrow for further evaluation. Turn to the ER for new or worsening symptoms.  Altered Mental Status Altered mental status most often refers to an abnormal change in your responsiveness and awareness. It can affect your speech, thought, mobility, memory, attention span, or alertness. It can range from slight confusion to complete unresponsiveness (coma). Altered mental status can be a sign of a serious underlying medical condition. Rapid evaluation and medical treatment is necessary for patients having an altered mental status. CAUSES   Low blood sugar (hypoglycemia) or diabetes.  Severe loss of body fluids (dehydration) or a body salt (electrolyte) imbalance.  A stroke or other neurologic problem, such as dementia or delirium.  A head injury or tumor.  A drug or alcohol overdose.  Exposure to toxins or poisons.  Depression, anxiety, and stress.  A low oxygen level (hypoxia).  An infection.  Blood loss.  Twitching or shaking (seizure).  Heart problems, such as heart attack or heart rhythm problems (arrhythmias).  A body temperature that is too low or too high (hypothermia or hyperthermia). DIAGNOSIS  A diagnosis is based on your history, symptoms, physical and neurologic examinations, and diagnostic tests. Diagnostic tests may include:  Measurement of your blood pressure, pulse, breathing, and oxygen levels (vital signs).  Blood tests.  Urine tests.  X-ray exams.  A computerized magnetic scan (magnetic resonance imaging, MRI).  A computerized X-ray scan (computed tomography, CT scan). TREATMENT  Treatment will depend on the cause. Treatment may include:  Management of an underlying medical or mental health condition.  Critical care or support in the hospital. HOME CARE INSTRUCTIONS   Only take over-the-counter or prescription medicines for pain, discomfort, or fever as directed by your caregiver.  Manage underlying  conditions as directed by your caregiver.  Eat a healthy, well-balanced diet to maintain strength.  Join a support group or prevention program to cope with the condition or trauma that caused the altered mental status. Ask your caregiver to help choose a program that works for you.  Follow up with your caregiver for further examination, therapy, or testing as directed. SEEK MEDICAL CARE IF:   You feel unwell or have chills.  You or your family notice a change in your behavior or your alertness.  You have trouble following your caregiver's treatment plan.  You have questions or concerns. SEEK IMMEDIATE MEDICAL CARE IF:   You have a rapid heartbeat or have chest pain.  You have difficulty breathing.  You have a fever.  You have a headache with a stiff neck.  You cough up blood.  You have blood in your urine or stool.  You have severe agitation or confusion. MAKE SURE YOU:   Understand these instructions.  Will watch your condition.  Will get help right away if you are not doing well or get worse. Document Released: 12/28/2009 Document Revised: 10/02/2011 Document Reviewed: 12/28/2009 [facility] Patient Information 2015 [employer] [place]. This information is not intended to replace advice given to you by your health care provider. Make sure you discuss any questions you have with your health care provider.

## 2015-02-01 NOTE — ED Provider Notes (Signed)
[facility] Emergency Department Provider Note ____________________________________________  Time seen: Approximately 1925 PM on arrival  I have reviewed the triage vital signs and the nursing notes.   HISTORY  Chief Complaint Loss of Consciousness  History Limited by poor comprehension. Initial history provided by EMS. Son provided further history upon his arrival.  HPI Abigail [name] is a 81 y.o. female who was brought by EMS from [facility] where she is under hospice care for failure to thrive and weight loss. She has not been ambulatory for the past year. She was sitting in her room watching TV and was unresponsive. The son states she is normally sleepy at this time of day and it is possible that she just fell asleep. He has not had any recent illness, fever, vomiting, diarrhea, cough. She reportedly told the nurse that she didn't feel well which is unusual for her but son is not aware of this.  Past Medical History  Diagnosis Date  . Dementia   . Alzheimer disease     There are no active problems to display for this patient.   No past surgical history on file.  No current outpatient prescriptions on file.  Allergies Celebrex; Codeine; Nsaids; Premarin; Sudafed; and Tape  No family history on file.  Social History History  Substance Use Topics  . Smoking status: Former Games developer  . Smokeless tobacco: Not on file  . Alcohol Use: No    Review of Systems Constitutional: No fever Cardiovascular: Denies chest pain. Respiratory: Denies shortness of breath. Gastrointestinal: No abdominal pain.   10-point ROS otherwise negative.  ____________________________________________   PHYSICAL EXAM:  VITAL SIGNS: ED Triage Vitals  Enc Vitals Group     BP 02/01/15 1951 141/57 mmHg     Pulse Rate 02/01/15 1951 63     Resp 02/01/15 1951 15     Temp 02/01/15 1951 98 F (36.7 C)     Temp Source 02/01/15 1951 Oral     SpO2 02/01/15 1951 98 %   Weight 02/01/15 1951 115 lb 11.9 oz (52.501 kg)     Height 02/01/15 1951  (1.626 m)     Head Cir --      Peak Flow --      Pain Score 02/01/15 1952 8     Pain Loc --      Pain Edu? --      Excl. in GC? --    Constitutional: Alert. Well appearing and in no acute distress. Eyes: Conjunctivae are normal. PERRL. EOMI. Head: Atraumatic. Nose: No congestion/rhinnorhea. Mouth/Throat: Mucous membranes are slightly dry.  Oropharynx non-erythematous. Neck: No stridor.   Lymphatic: No cervical lymphadenopathy. Cardiovascular: Normal rate, regular rhythm. Grossly normal heart sounds.  Peripheral pulses 2+ B Respiratory: Normal respiratory effort.  No retractions. Lungs CTAB. Gastrointestinal: Soft and nontender. No distention. Normal bowel sounds.  Musculoskeletal: No lower extremity tenderness nor edema.  No calf TTP. Neurologic:  No gross focal neurologic deficits are appreciated. Facial symmetry and does move all 4 extremities Skin:  Skin is warm, dry and intact. No rash noted. Psychiatric: Mood and affect are calm. Speech and behavior are at her baseline per her son. She does not speak very much and when she does it is very quiet.  ____________________________________________   LABS (all labs ordered are listed, but only abnormal results are displayed)  Labs Reviewed  CBC WITH DIFFERENTIAL/PLATELET - Abnormal; Notable for the following:    RDW 14.7 (*)    All other components  within normal limits  BASIC METABOLIC PANEL  URINALYSIS COMPLETEWITH MICROSCOPIC ([facility] ONLY)   ____________________________________________  EKG   Date: 02/01/2015  Rate: 65  Rhythm: normal sinus rhythm  QRS Axis: normal  Intervals: RBBB  ST/T Wave abnormalities: normal  Conduction Disutrbances: none  Narrative Interpretation: unremarkable  ____________________________________________  RADIOLOGY  Chest x-ray- ____________________________________________   PROCEDURES  Procedure(s)  performed: none  Critical Care performed: none ____________________________________________   INITIAL IMPRESSION / ASSESSMENT AND PLAN / ED COURSE  Pertinent labs & imaging results that were available during my care of the patient were reviewed by me and considered in my medical decision making (see chart for details).  Unclear if patient fell asleep or had a syncopal episode as she was sitting in her chair watching TV at a time of night when she is typically sleepy. She is under hospice care for failure to thrive. Will perform basic laboratory data and urinalysis and if these tests are negative we'll D/C to NH. Son feels patient is at her baseline and agrees with this plan. ____________________________________________   FINAL CLINICAL IMPRESSION(S) / ED DIAGNOSES  Brief unresponsive episode     Maurilio LovelyNoelle Kalise Fickett, MD 02/01/15 2024

## 2015-02-01 NOTE — ED Notes (Addendum)
Called report to [facility] spoke with Gardiner RhymeIra.

## 2015-05-25 DEATH — deceased

## 2016-01-11 IMAGING — CR DG CHEST 2V
1 series · 2 of 2 positions shown · non-contrast
Comparison: Chest x-ray 03/17/2014.

CLINICAL DATA: 89-year-old female with history of syncopal episode
today.

EXAM:
CHEST  2 VIEW

[Series 1: dg chest 2 view · 0.14mm/px · 2 of 2 slices shown]
[im 1/2]
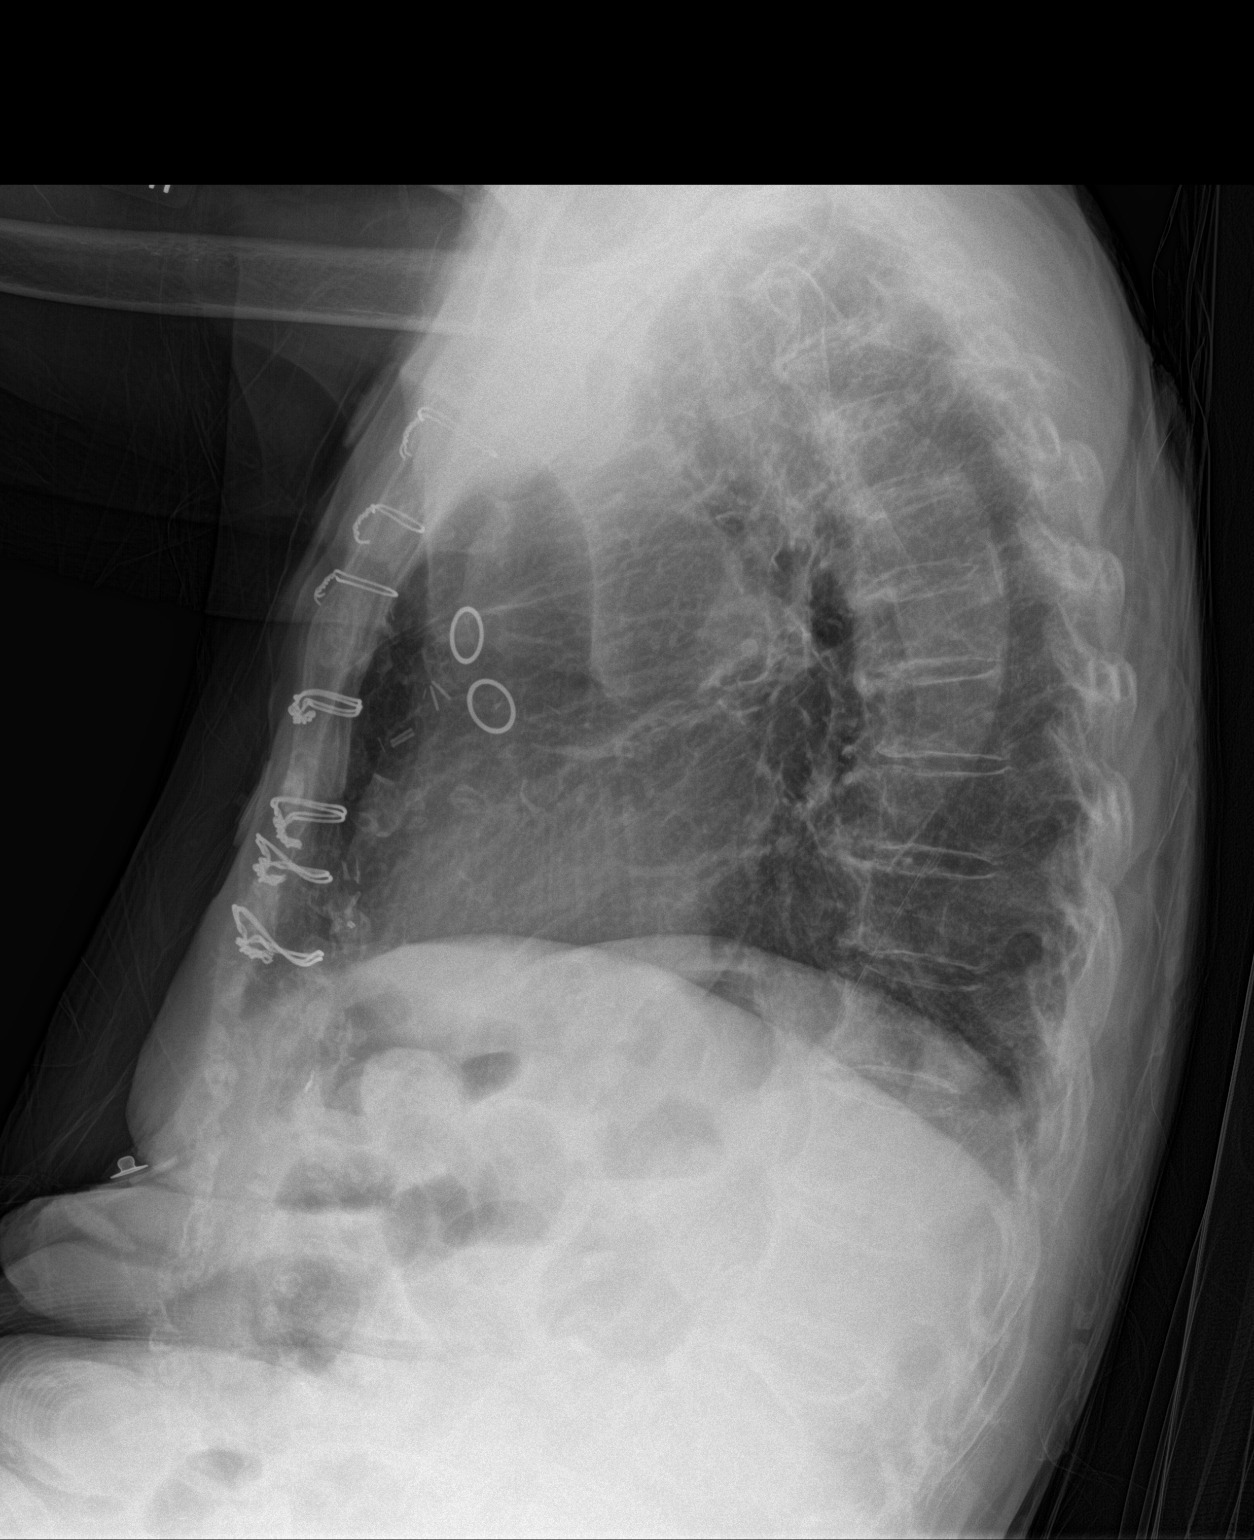
[im 2/2]
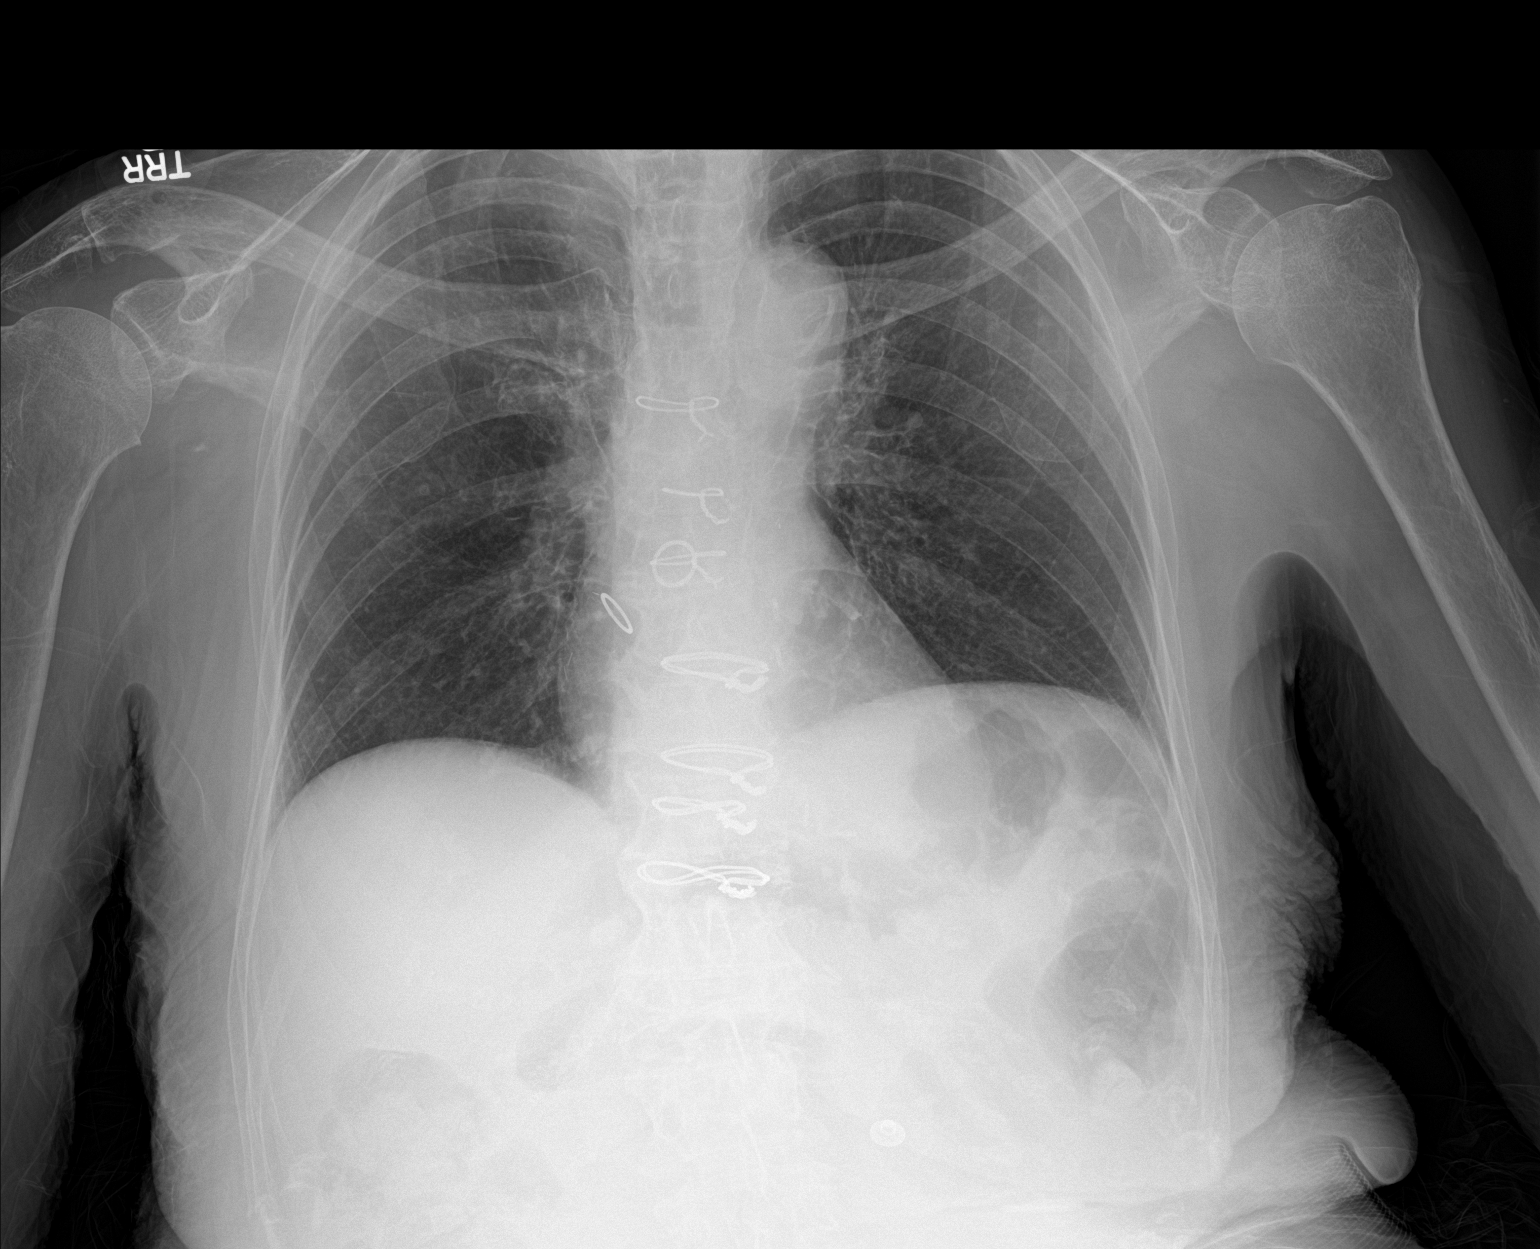

[2 of 2 positions shown; findings below may reference images not displayed]

FINDINGS: Lung volumes are low. No consolidative airspace disease. No pleural
effusions. No pneumothorax. No pulmonary nodule or mass noted.
Pulmonary vasculature and the cardiomediastinal silhouette are
within normal limits. Atherosclerosis in the thoracic aorta. Status
post median sternotomy for CABG.
IMPRESSION: 1. Low lung volumes without radiographic evidence of acute
cardiopulmonary disease.
2. Atherosclerosis.
